# Patient Record
Sex: Male | Born: 1960
Health system: Southern US, Community
[De-identification: ages and names within clinical notes are randomized; demographics above are authoritative.]

## PROBLEM LIST (undated history)

## (undated) DIAGNOSIS — T7840XA Allergy, unspecified, initial encounter: Secondary | ICD-10-CM

## (undated) DIAGNOSIS — I10 Essential (primary) hypertension: Secondary | ICD-10-CM

## (undated) DIAGNOSIS — Z86010 Personal history of colonic polyps: Secondary | ICD-10-CM

## (undated) DIAGNOSIS — M1990 Unspecified osteoarthritis, unspecified site: Secondary | ICD-10-CM

## (undated) DIAGNOSIS — E119 Type 2 diabetes mellitus without complications: Secondary | ICD-10-CM

## (undated) DIAGNOSIS — G4730 Sleep apnea, unspecified: Secondary | ICD-10-CM

## (undated) DIAGNOSIS — E785 Hyperlipidemia, unspecified: Secondary | ICD-10-CM

## (undated) HISTORY — DX: Type 2 diabetes mellitus without complications: E11.9

## (undated) HISTORY — DX: Unspecified osteoarthritis, unspecified site: M19.90

## (undated) HISTORY — PX: UMBILICAL HERNIA REPAIR: SHX196

## (undated) HISTORY — PX: KNEE ARTHROSCOPY: SUR90

## (undated) HISTORY — DX: Sleep apnea, unspecified: G47.30

## (undated) HISTORY — PX: OTHER SURGICAL HISTORY: SHX169

## (undated) HISTORY — DX: Hyperlipidemia, unspecified: E78.5

## (undated) HISTORY — PX: POLYPECTOMY: SHX149

## (undated) HISTORY — DX: Allergy, unspecified, initial encounter: T78.40XA

## (undated) HISTORY — DX: Essential (primary) hypertension: I10

## (undated) HISTORY — DX: Personal history of colonic polyps: Z86.010

## (undated) HISTORY — PX: COLONOSCOPY: SHX174

---
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
## 2001-08-31 ENCOUNTER — Encounter: Admission: RE | Admit: 2001-08-31 | Discharge: 2001-08-31 | Payer: Self-pay | Admitting: General Surgery

## 2001-08-31 ENCOUNTER — Encounter (HOSPITAL_BASED_OUTPATIENT_CLINIC_OR_DEPARTMENT_OTHER): Payer: Self-pay | Admitting: General Surgery

## 2001-08-31 ENCOUNTER — Ambulatory Visit (HOSPITAL_BASED_OUTPATIENT_CLINIC_OR_DEPARTMENT_OTHER): Admission: RE | Admit: 2001-08-31 | Discharge: 2001-08-31 | Payer: Self-pay | Admitting: General Surgery

## 2001-08-31 ENCOUNTER — Encounter (INDEPENDENT_AMBULATORY_CARE_PROVIDER_SITE_OTHER): Payer: Self-pay | Admitting: Specialist

## 2007-10-22 ENCOUNTER — Encounter: Admission: RE | Admit: 2007-10-22 | Discharge: 2007-10-22 | Payer: Self-pay | Admitting: Orthopedic Surgery

## 2007-10-23 ENCOUNTER — Encounter: Admission: RE | Admit: 2007-10-23 | Discharge: 2007-10-23 | Payer: Self-pay | Admitting: Orthopedic Surgery

## 2007-11-13 ENCOUNTER — Ambulatory Visit (HOSPITAL_COMMUNITY): Admission: RE | Admit: 2007-11-13 | Discharge: 2007-11-13 | Payer: Self-pay | Admitting: Orthopedic Surgery

## 2008-07-28 IMAGING — CR DG CHEST 2V
2 series · 2 of 2 positions shown · non-contrast
Comparison: No prior studies are available for comparison.

CLINICAL DATA: Medical meniscal tear. Hypertension. Preadmission workup.
 CHEST - 2 VIEW:

[view not recorded (1 of 2)]
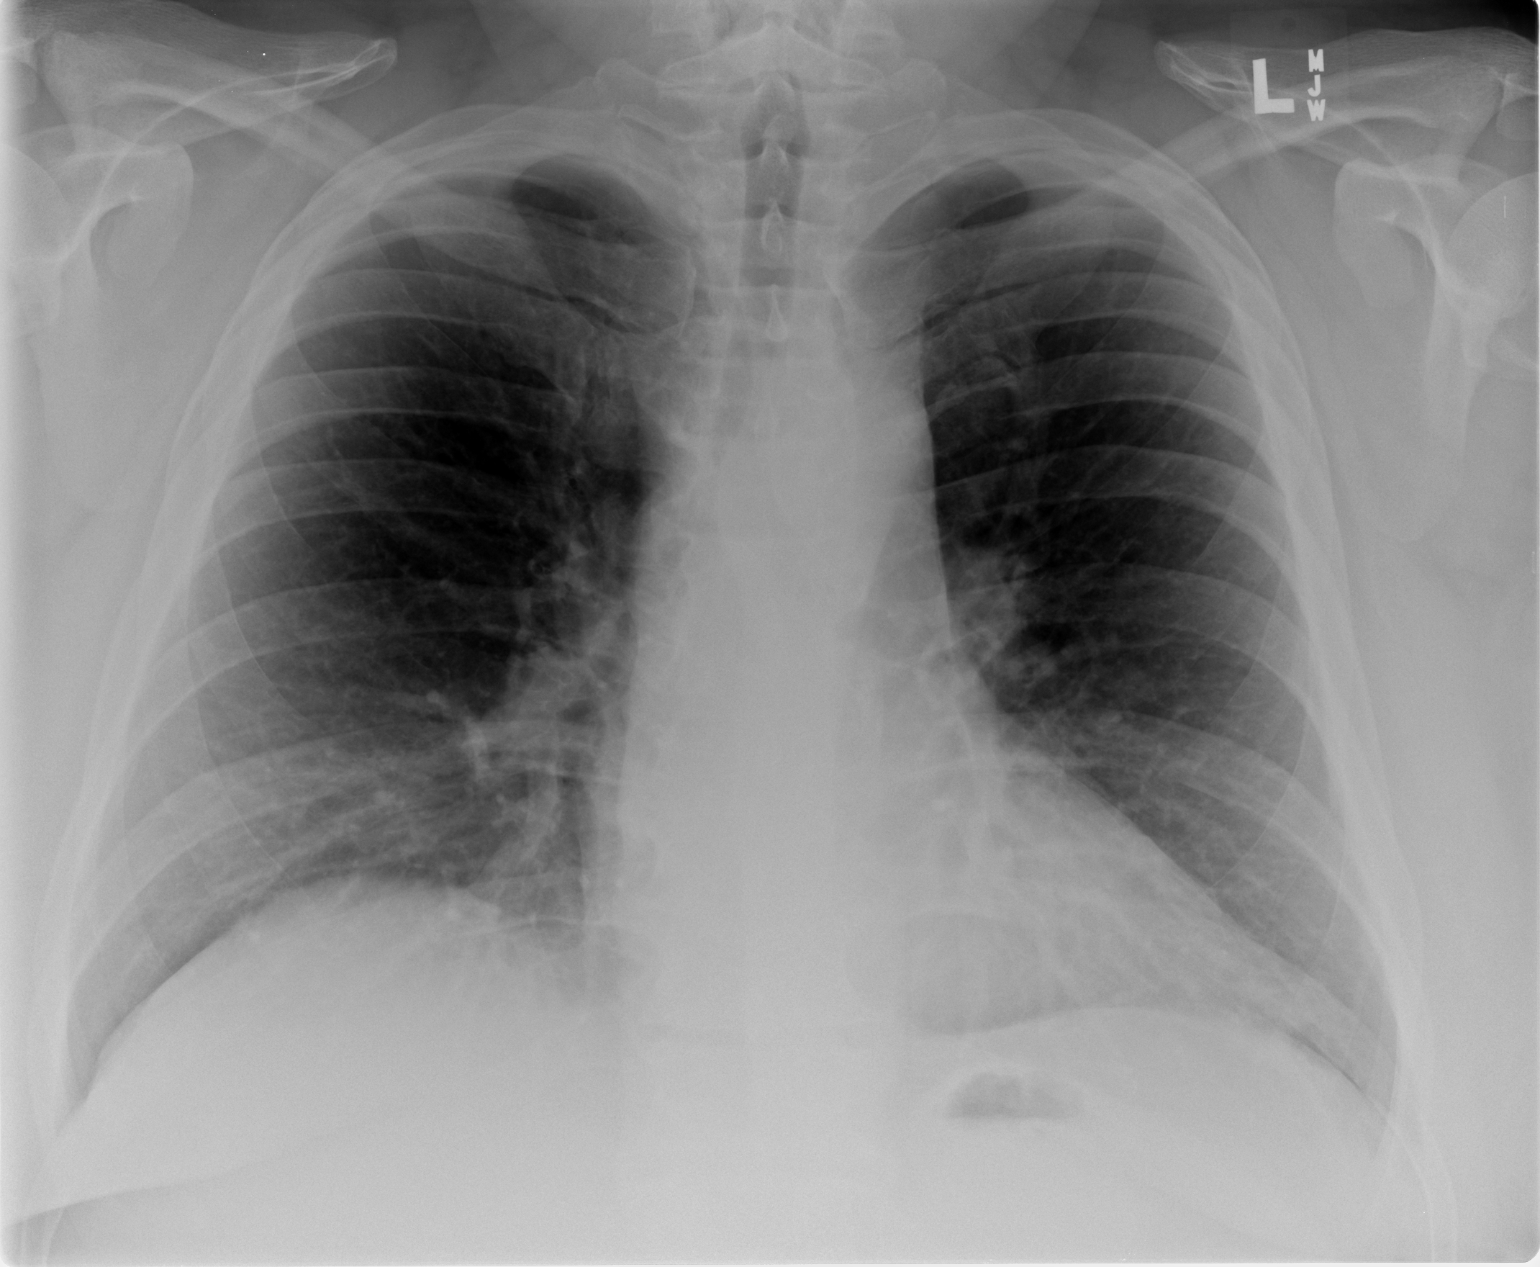

[view not recorded (2 of 2)]
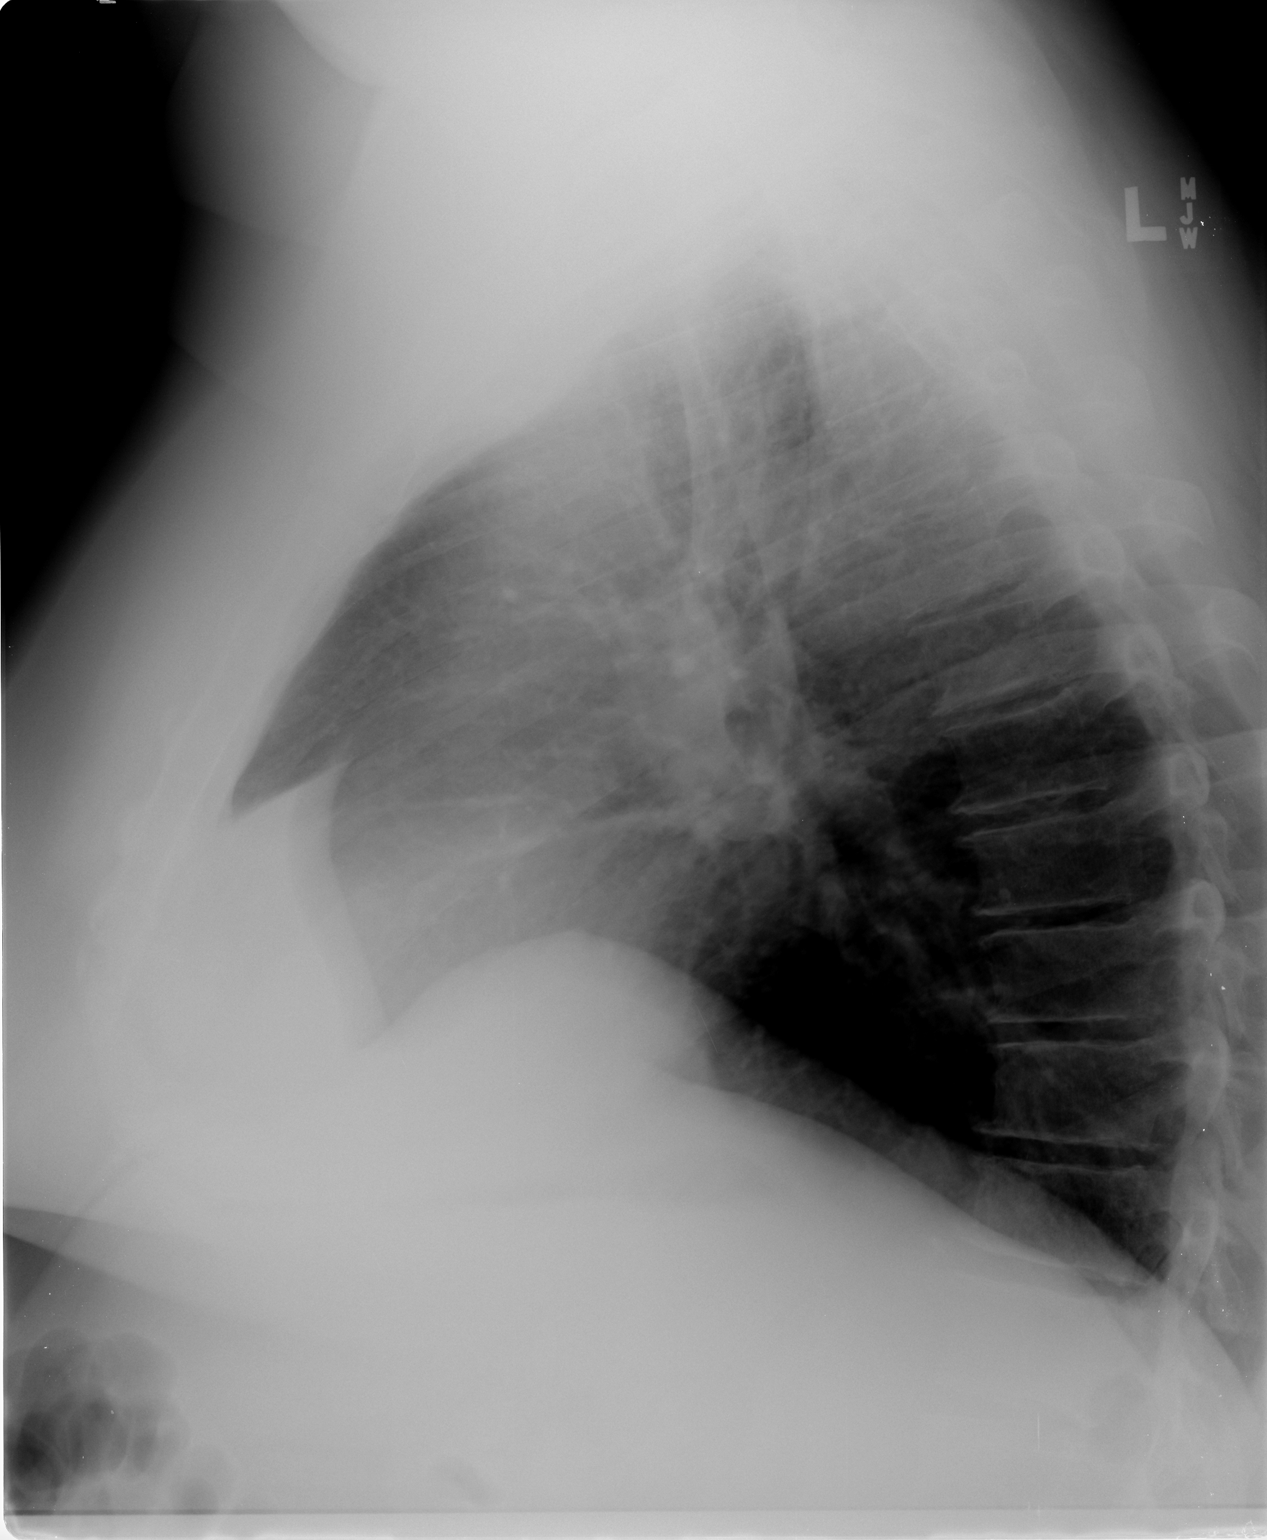

[2 of 2 positions shown; findings below may reference images not displayed]

FINDINGS: Normal cardiac size and shape.  Slight prominence of the caliber of the superior mediastinum likely due to mediastinal lipomatosis. Accentuation of peribronchial markings mainly in the mid to lower lung zones is compatible with chronic bronchitic changes.  No evidence of acute chest disease. No infiltrate. Intact bony thorax.
IMPRESSION: Chronic bronchitic markings. No active chest findings.

## 2009-03-25 ENCOUNTER — Encounter: Admission: RE | Admit: 2009-03-25 | Discharge: 2009-03-25 | Payer: Self-pay | Admitting: Gastroenterology

## 2011-02-08 NOTE — Op Note (Signed)
Christopher Spence, Christopher Spence                 ACCOUNT NO.:  [id number]   MEDICAL RECORD NO.:  [id number]          PATIENT TYPE:  AMB   LOCATION:  SDS                          FACILITY:  [facility]   PHYSICIAN:  Rodney A. Mortenson, M.D.DATE OF BIRTH:  12/31/1960   DATE OF PROCEDURE:  11/13/2007  DATE OF DISCHARGE:                               OPERATIVE REPORT   JUSTIFICATION:  A 46-year male with severe arthritis of his left knee  and very complex torn medial meniscus.  He has significant pain along  the medial joint line.  He is now admitted for debridement of the medial  meniscus in hopes this will diminish his pain to a tolerable level.  Complications discussed preoperatively.  Questions answered and  encouraged.   JUSTIFICATION FOR OUTPATIENT SURGERY:  Minimal morbidity.   PREOPERATIVE DIAGNOSIS:  Osteoarthritis left knee; complex tear of  posterior one half medial meniscus of left knee; early chondromalacia  patellofemoral junction.   POSTOPERATIVE DIAGNOSIS:  Osteoarthritis left knee; complex tear of  posterior one half medial meniscus of left knee; early chondromalacia  patellofemoral junction.   OPERATION:  Arthroscopy; chondroplasty patella and trochlear notch,  femur and medial femoral condyle; subtotal medial meniscectomy, left  knee.   SURGEON:  Lenard Galloway. Chaney Malling, M.D.   ASSISTANT:  Arlys John D. Petrarca, P.A.-C.   ANESTHESIA:  MAC.   PATHOLOGY:  With the arthroscope in the knee, a very careful examination  was undertaken.  Patellofemoral joint showed severe fraying and tearing  of articular cartilage on the posterior aspect of the patella and in the  trochlear notch.  There were areas of full-thickness cartilage loss on  both sides.  The anterior cruciate ligament was normal.  In the lateral  compartments there was fairly normal-looking articular cartilage of the  lateral femoral condyle, lateral tibial plateau and entire lateral  meniscus was essentially normal with some  minor fraying.  The  arthroscope was then placed the medial compartment.  There was an  extensive complex tear of the medial meniscus which included the entire  posterior half of medial meniscus.  This was trapped between the femur  and the tibia.  There was marked fraying and tearing of articular  cartilage of medial femoral condyle and tibial plateau between the  meniscus.   PROCEDURE:  The patient placed on the operating table in supine position  with pneumatic tourniquet about the left upper thigh.  Left leg was  placed in leg holder.  The entire left lower extremity was prepped with  Betadine and draped out in the usual manner.  Infusion cannula was  placed in the superior medial pouch and the knee distended with saline.  Anteromedial, anterolateral portals made.  The arthroscope was  introduced.  The patellofemoral joint was debrided through both medial  and lateral portals.  The frayed and torn cartilage were removed off the  posterior aspect of the patella and in the trochlear notch area of the  femur.  The arthroscope was then placed in the lateral compartment and  this was fairly normal looking lateral compartment as noted above.  In  the medial compartment was extensive tearing of medial meniscus.  Through both medial lateral portals, a series of varus size baskets were  inserted and this was very aggressively debrided.  Once this was  accomplished to my satisfaction, the intra-articular shaver was  introduced and all debris was removed.  The remaining rim was then  smoothed and balanced a nice transition to the anterior one half of the  medial meniscus.  Anterior half was essentially intact.  The femoral  condyle had marked fraying and tearing of articular cartilage and  chondroplasty shaver was used to debride this.  After this was  accomplished, knee was then filled with Marcaine.  A large bulky  pressure dressing applied and the patient returned to recovery room in   excellent condition.  Technically this went extremely well.   DISPOSITION:  1. Discharge.  2. Change dressings on Thursday.  3. Percocet for pain.  4. Usual postop instructions given.  5. To my office on Monday.      Rodney A. Chaney Malling, M.D.  Electronically Signed     RAM/MEDQ  D:  11/13/2007  T:  11/14/2007  Job:  [id number]

## 2011-02-11 NOTE — Op Note (Signed)
[place]  Patient:    Christopher Spence, Christopher Spence Visit Number: [id number] MRN: [id number]          Service Type: DSU Location: [facility] Attending Physician:  Fortino Sic Dictated by:   Marnee Spring. Wiliam Ke, M.D. Proc. Date: 08/31/01 Admit Date:  08/31/2001                             Operative Report  INCOMPLETE  PREOPERATIVE DIAGNOSIS:  Incarcerated recurrent umbilical hernia.  POSTOPERATIVE DIAGNOSIS:  Incarcerated recurrent umbilical hernia.  PROCEDURE: Dictated by:   Marnee Spring. Wiliam Ke, M.D. Attending Physician:  Fortino Sic DD:  08/31/01 TD:  09/01/01 Job: [id number]

## 2011-02-11 NOTE — Op Note (Signed)
[place]  Patient:    FERN, ASMAR Visit Number: [id number] MRN: [id number]          Service Type: DSU Location: [facility] Attending Physician:  Fortino Sic Dictated by:   Marnee Spring. Wiliam Ke, M.D. Proc. Date: 08/31/01 Admit Date:  08/31/2001                             Operative Report  PREOPERATIVE DIAGNOSIS:  Incarcerated recurrent umbilical hernia.  POSTOPERATIVE DIAGNOSIS:  Incarcerated recurrent umbilical hernia.  PROCEDURE:  Repair of incarcerated recurrent umbilical hernia with Marlex patch.  SURGEON:  Marnee Spring. Wiliam Ke, M.D.  ANESTHESIA:  Endotracheal by hospital.  DESCRIPTION OF PROCEDURE:  Under good endotracheal anesthesia, the skin of the abdomen was prepped and draped in the usual manner.  Previously-used subumbilical incision was opened and elongated.  There was a huge hernia sac, which was dissected free from all surrounding soft tissue, including the skin of the umbilicus.  It was opened and was found to contain incarcerated omentum.  The hernia sac was trimmed.  The omentum could not be replaced.  It was serially clamped, cut, and tied off with 0 silk ties.  Finally the omentum could be replaced in the abdominal cavity.  Several small hernias around the circumference of the large hernia were felt with a finger inside the abdominal cavity.  The scarred tissue bands were divided to make one large hernia.  This hernia was then repaired with a Marlex patch, which was sewn in place with horizontal mattress sutures of musculofascial #1 Novofil.  Soft tissue of the parietes was then closed over the patch with 0 Novofil sutures.  Several 3-0 Vicryls were placed in the subcutaneous tissue.  The skin was closed with subcuticular 4-0 Vicryl and Steri-Strips were applied.  A dressing was applied.  Estimated blood loss was minimal.  The patient received no blood and left the operating room in satisfactory condition after sponge and  needle counts were verified. Dictated by:   Marnee Spring. Wiliam Ke, M.D. Attending Physician:  Fortino Sic DD:  08/31/01 TD:  09/01/01 Job: [id number]

## 2011-06-17 LAB — COMPREHENSIVE METABOLIC PANEL
AST: 36
Albumin: 3.9
BUN: 10
Calcium: 9.8
Creatinine, Ser: 0.78
GFR calc Af Amer: >60
Total Bilirubin: 0.8
Total Protein: 6.3

## 2011-06-17 LAB — CBC
HCT: 43.8
MCHC: 34.1
MCV: 86.5
Platelets: 208
RDW: 12.7
WBC: 6.9

## 2011-06-17 LAB — DIFFERENTIAL
Basophils Absolute: 0
Eosinophils Relative: 1
Lymphocytes Relative: 32
Lymphs Abs: 2.2
Monocytes Absolute: 1
Neutro Abs: 3.7

## 2011-06-17 LAB — URINALYSIS, ROUTINE W REFLEX MICROSCOPIC
Glucose, UA: NEGATIVE
Ketones, ur: NEGATIVE
Nitrite: NEGATIVE
Protein, ur: NEGATIVE
pH: 6

## 2011-06-17 LAB — APTT: aPTT: 26

## 2012-07-06 ENCOUNTER — Encounter: Payer: Self-pay | Admitting: Internal Medicine

## 2012-08-08 ENCOUNTER — Ambulatory Visit (AMBULATORY_SURGERY_CENTER): Payer: 59 | Admitting: *Deleted

## 2012-08-08 ENCOUNTER — Telehealth: Payer: Self-pay | Admitting: *Deleted

## 2012-08-08 ENCOUNTER — Encounter: Payer: Self-pay | Admitting: Internal Medicine

## 2012-08-08 VITALS — Ht 73.0 in | Wt 365.8 lb

## 2012-08-08 DIAGNOSIS — Z1211 Encounter for screening for malignant neoplasm of colon: Secondary | ICD-10-CM

## 2012-08-08 NOTE — Telephone Encounter (Signed)
Sheri: Pt here for PV for screening colonoscopy with Dr. Leone Payor. pt's weight is 365.8 lb; BMI 48.26.  Pt informed that procedure will need to be done at [facility].  He prefers a Monday.  He is agreeable to wait until Jan to have procedure.  I told him that you would call mid December to schedule.  He also knows to call you if he has not heard from you.  I gave him a copy of Suprep instructions and he knows you will review when procedure is scheduled; he is diabetic.  He did not want to get rx for prep until procedure was scheduled so it has not been sent to his pharmacy.  Best phone number to reach pt: [phone]. Thanks, Olegario Messier

## 2012-08-08 NOTE — Telephone Encounter (Signed)
I have sent myself a reminder flag to contact the patient mid december

## 2012-08-08 NOTE — Progress Notes (Signed)
Pt here for PV for screening colonoscopy with Dr. Leone Payor..  Pt's weight is 365.8 lb.  I talked with Darcey Nora to scheduled pt at hospital due to weight.  Pt prefers to have procedure on a Monday.  Lavonna Rua will call pt to schedule procedure in mid December for a January procedure.

## 2012-08-22 ENCOUNTER — Encounter: Payer: Self-pay | Admitting: Internal Medicine

## 2012-09-11 ENCOUNTER — Telehealth: Payer: Self-pay

## 2012-09-11 DIAGNOSIS — Z1211 Encounter for screening for malignant neoplasm of colon: Secondary | ICD-10-CM

## 2012-09-11 NOTE — Telephone Encounter (Signed)
Hospital week for Dr. Leone Payor is 10/15/12 -10/19/12

## 2012-09-11 NOTE — Telephone Encounter (Signed)
Message copied by Annett Fabian on Tue Sep 11, 2012  4:26 PM ------      Message from: Annett Fabian      Created: Wed Aug 08, 2012 10:25 AM       Needs colon at hospital January hospital week for gessner.  Can only come on a Monday

## 2012-09-11 NOTE — Telephone Encounter (Signed)
Patient notified about scheduling colonoscopy.  Patient states he needs to call me back tomorrow .

## 2012-09-20 ENCOUNTER — Other Ambulatory Visit: Payer: Self-pay

## 2012-09-20 ENCOUNTER — Encounter: Payer: Self-pay | Admitting: Internal Medicine

## 2012-09-20 DIAGNOSIS — Z1211 Encounter for screening for malignant neoplasm of colon: Secondary | ICD-10-CM

## 2012-09-20 MED ORDER — PEG-KCL-NACL-NASULF-NA ASC-C 100 G PO SOLR: 1.0000 | Freq: Once | ORAL | Status: DC

## 2012-09-20 NOTE — Telephone Encounter (Signed)
Patient is scheduled for 10/16/11 @ 0830 at [facility].Marland Kitchen  He already has had a previsit and I have reviewed the new instruction dates and times with him and mailed a new copy.

## 2012-10-15 ENCOUNTER — Encounter (HOSPITAL_COMMUNITY)
Admission: RE | Disposition: A | Discharge status: Home / Self Care | Payer: Self-pay | Source: Ambulatory Visit | Attending: Internal Medicine

## 2012-10-15 ENCOUNTER — Encounter (HOSPITAL_COMMUNITY): Payer: Self-pay | Admitting: *Deleted

## 2012-10-15 ENCOUNTER — Ambulatory Visit (HOSPITAL_COMMUNITY)
Admission: RE | Admit: 2012-10-15 | Discharge: 2012-10-15 | Disposition: A | Discharge status: Home / Self Care | Payer: 59 | Source: Ambulatory Visit | Attending: Internal Medicine | Admitting: Internal Medicine

## 2012-10-15 DIAGNOSIS — E119 Type 2 diabetes mellitus without complications: Secondary | ICD-10-CM | POA: Insufficient documentation

## 2012-10-15 DIAGNOSIS — Z860100 Personal history of colon polyps, unspecified: Secondary | ICD-10-CM

## 2012-10-15 DIAGNOSIS — Z1211 Encounter for screening for malignant neoplasm of colon: Secondary | ICD-10-CM

## 2012-10-15 DIAGNOSIS — D126 Benign neoplasm of colon, unspecified: Secondary | ICD-10-CM | POA: Insufficient documentation

## 2012-10-15 DIAGNOSIS — E785 Hyperlipidemia, unspecified: Secondary | ICD-10-CM | POA: Insufficient documentation

## 2012-10-15 DIAGNOSIS — I10 Essential (primary) hypertension: Secondary | ICD-10-CM | POA: Insufficient documentation

## 2012-10-15 DIAGNOSIS — D129 Benign neoplasm of anus and anal canal: Secondary | ICD-10-CM

## 2012-10-15 DIAGNOSIS — D128 Benign neoplasm of rectum: Secondary | ICD-10-CM

## 2012-10-15 HISTORY — DX: Personal history of colonic polyps: Z86.010

## 2012-10-15 HISTORY — PX: COLONOSCOPY: SHX5424

## 2012-10-15 HISTORY — DX: Personal history of colon polyps, unspecified: Z86.0100

## 2012-10-15 LAB — GLUCOSE, CAPILLARY: Glucose-Capillary: 148 mg/dL — ABNORMAL HIGH (ref 70–99)

## 2012-10-15 SURGERY — COLONOSCOPY
Anesthesia: Moderate Sedation

## 2012-10-15 MED ORDER — SODIUM CHLORIDE 0.9 % IV SOLN: INTRAVENOUS | Status: DC

## 2012-10-15 MED ORDER — FENTANYL CITRATE 0.05 MG/ML IJ SOLN
INTRAMUSCULAR | Status: AC
  Filled 2012-10-15: qty 2

## 2012-10-15 MED ORDER — FENTANYL CITRATE 0.05 MG/ML IJ SOLN
INTRAMUSCULAR | Status: DC | PRN
  Administered 2012-10-15 (×4): 25 ug via INTRAVENOUS

## 2012-10-15 MED ORDER — MIDAZOLAM HCL 5 MG/5ML IJ SOLN
INTRAMUSCULAR | Status: DC | PRN
  Administered 2012-10-15 (×4): 2 mg via INTRAVENOUS

## 2012-10-15 MED ORDER — MIDAZOLAM HCL 10 MG/2ML IJ SOLN
INTRAMUSCULAR | Status: AC
  Filled 2012-10-15: qty 2

## 2012-10-15 NOTE — H&P (Signed)
  HPI  52 yo man here for routine colon cancer screening with colonoscopy. He has no active GI c/o.    No Known Allergies Outpatient meds reviewed Past Medical History  Diagnosis Date  . Diabetes   . Hypertension   . Arthritis     knees  . Hyperlipidemia    Past Surgical History  Procedure Date  . Umbilical hernia repair     x 2  . Knee arthrotomy 1976    right  . Knee arthroscopy     bilateral; twice on each knee   History   Social History  . Marital Status: Married    Spouse Name: N/A    Number of Children: N/A  . Years of Education: N/A   Social History Main Topics  . Smoking status: Never Smoker   . Smokeless tobacco: Never Used  . Alcohol Use: 21.0 oz/week    35 Cans of beer per week     Comment: 4-5 times weekly,4 to 5 beers  . Drug Use: No  . Sexually Active: None   Other Topics Concern  . None   Social History Narrative  . None   Family History  Problem Relation Age of Onset  . Colon cancer Neg Hx   . Stomach cancer Neg Hx     PE See pre-sedation evaluation   Ass/Plan  52 yo routine risk patient for colorectal cancer screening.  Colonoscopy to be performed with moderate sedation.  The risks and benefits as well as alternatives of endoscopic procedure(s) have been discussed and reviewed. All questions answered. The patient agrees to proceed.

## 2012-10-15 NOTE — Op Note (Signed)
[facility] [address] [place], [address]   COLONOSCOPY PROCEDURE REPORT  PATIENT: Christopher Spence, Christopher Spence  MR#: [id number] BIRTHDATE: 1961-07-28 , 51  yrs. old GENDER: Male ENDOSCOPIST: Iva Boop, MD, [facility] REFERRED VH:QIONGE Tanya Nones, M.D. PROCEDURE DATE:  10/15/2012 PROCEDURE:   Colonoscopy with snare polypectomy ASA CLASS:   Class II INDICATIONS:Average risk patient for colorectal cancer. MEDICATIONS: Propofol (Diprivan), Fentanyl 100 mcg IV, and Versed 8 mg IV  DESCRIPTION OF PROCEDURE:   After the risks benefits and alternatives of the procedure were thoroughly explained, informed consent was obtained.  A digital rectal exam revealed no abnormalities of the rectum and A digital rectal exam revealed the prostate was not enlarged.   The Pentax Colonoscope [id number] endoscope was introduced through the anus and advanced to the cecum, which was identified by both the appendix and ileocecal valve. No adverse events experienced.   The quality of the prep was good, using MoviPrep  The instrument was then slowly withdrawn as the colon was fully examined.      COLON FINDINGS: A polypoid shaped semi-pedunculated polyp measuring 12 mm in size was found in the sigmoid colon.  A polypectomy was performed using snare cautery.  The resection was complete and the polyp tissue was completely retrieved.   Two polypoid shaped sessile polyps measuring 4-7 mm in size were found in the sigmoid colon and rectum.  A polypectomy was performed with a cold snare. The resection was complete and the polyp tissue was completely retrieved.   The colon mucosa was otherwise normal.   A right colon retroflexion was performed.  Retroflexed views in rectum and right colon revealed no abnormalities except rectal polyp. The time to cecum=2 minutes 0 seconds.  Withdrawal time=15 minutes 0 seconds. The scope was withdrawn and the procedure completed. COMPLICATIONS: There were no  complications.  ENDOSCOPIC IMPRESSION: 1.   Semi-pedunculated polyp measuring 12 mm in size was found in the sigmoid colon; polypectomy was performed using snare cautery 2.   Two sessile polyps measuring 4-7 mm in size were found in the sigmoid colon and rectum; polypectomy was performed with a cold snare 3.   The colon mucosa was otherwise normal - excellent prep - index screening colonoscopy  RECOMMENDATIONS: 1.  Hold aspirin, aspirin products, and anti-inflammatory medication for 1 week. 2.  Timing of repeat colonoscopy will be determined by pathology findings. eSigned:  Iva Boop, MD, [facility] 10/15/2012 9:33 AM   cc: Lynnea Ferrier, MD and The Patient

## 2012-10-16 ENCOUNTER — Encounter (HOSPITAL_COMMUNITY): Payer: Self-pay | Admitting: Internal Medicine

## 2012-10-17 ENCOUNTER — Encounter: Payer: Self-pay | Admitting: Internal Medicine

## 2012-10-17 NOTE — Progress Notes (Signed)
Quick Note:  3 adenomas max 12 mm Repeat colonoscopy about 09/2015 ______

## 2013-02-06 ENCOUNTER — Other Ambulatory Visit: Payer: Self-pay | Admitting: Family Medicine

## 2013-02-06 NOTE — Telephone Encounter (Addendum)
Medication refilled per protocol.Patient needs to be seen before any further refills.  Called pt and left mess to call and make appt.

## 2013-02-07 ENCOUNTER — Other Ambulatory Visit (INDEPENDENT_AMBULATORY_CARE_PROVIDER_SITE_OTHER): Payer: 59

## 2013-02-07 DIAGNOSIS — Z0000 Encounter for general adult medical examination without abnormal findings: Secondary | ICD-10-CM

## 2013-02-07 DIAGNOSIS — E119 Type 2 diabetes mellitus without complications: Secondary | ICD-10-CM

## 2013-02-07 DIAGNOSIS — Z79899 Other long term (current) drug therapy: Secondary | ICD-10-CM

## 2013-02-07 DIAGNOSIS — I10 Essential (primary) hypertension: Secondary | ICD-10-CM

## 2013-02-07 DIAGNOSIS — K7581 Nonalcoholic steatohepatitis (NASH): Secondary | ICD-10-CM

## 2013-02-07 DIAGNOSIS — E785 Hyperlipidemia, unspecified: Secondary | ICD-10-CM

## 2013-02-07 LAB — LIPID PANEL
Cholesterol: 174 mg/dL (ref 0–200)
VLDL: 28 mg/dL (ref 0–40)

## 2013-02-07 LAB — CBC WITH DIFFERENTIAL/PLATELET
Basophils Absolute: 0 10*3/uL (ref 0.0–0.1)
HCT: 46.8 % (ref 39.0–52.0)
Hemoglobin: 16 g/dL (ref 13.0–17.0)
Lymphocytes Relative: 26 % (ref 12–46)
Monocytes Absolute: 0.6 10*3/uL (ref 0.1–1.0)
Neutro Abs: 4.4 10*3/uL (ref 1.7–7.7)
Neutrophils Relative %: 64 % (ref 43–77)
RDW: 13.5 % (ref 11.5–15.5)
WBC: 6.9 10*3/uL (ref 4.0–10.5)

## 2013-02-07 LAB — TSH: TSH: 1.475 u[IU]/mL (ref 0.350–4.500)

## 2013-02-07 LAB — HEMOGLOBIN A1C
Hgb A1c MFr Bld: 6.8 % — ABNORMAL HIGH (ref <5.7)
Mean Plasma Glucose: 148 mg/dL — ABNORMAL HIGH (ref <117)

## 2013-02-07 LAB — PSA: PSA: 1.09 ng/mL (ref <=4.00)

## 2013-02-08 LAB — VITAMIN D 25 HYDROXY (VIT D DEFICIENCY, FRACTURES): Vit D, 25-Hydroxy: 42 ng/mL (ref 30–89)

## 2013-02-21 ENCOUNTER — Encounter: Payer: Self-pay | Admitting: Family Medicine

## 2013-02-21 ENCOUNTER — Ambulatory Visit (INDEPENDENT_AMBULATORY_CARE_PROVIDER_SITE_OTHER): Payer: 59 | Admitting: Family Medicine

## 2013-02-21 VITALS — BP 150/90 | HR 78 | Temp 98.4°F | Resp 20 | Ht 73.0 in | Wt 365.0 lb

## 2013-02-21 DIAGNOSIS — E119 Type 2 diabetes mellitus without complications: Secondary | ICD-10-CM

## 2013-02-21 DIAGNOSIS — Z0000 Encounter for general adult medical examination without abnormal findings: Secondary | ICD-10-CM

## 2013-02-21 DIAGNOSIS — E785 Hyperlipidemia, unspecified: Secondary | ICD-10-CM | POA: Insufficient documentation

## 2013-02-21 DIAGNOSIS — I10 Essential (primary) hypertension: Secondary | ICD-10-CM

## 2013-02-21 LAB — COMPLETE METABOLIC PANEL WITHOUT GFR
ALT: 84 U/L — ABNORMAL HIGH (ref 0–53)
AST: 38 U/L — ABNORMAL HIGH (ref 0–37)
Albumin: 4.4 g/dL (ref 3.5–5.2)
Alkaline Phosphatase: 85 U/L (ref 39–117)
BUN: 14 mg/dL (ref 6–23)
CO2: 28 meq/L (ref 19–32)
Calcium: 9.7 mg/dL (ref 8.4–10.5)
Chloride: 102 meq/L (ref 96–112)
Creat: 0.78 mg/dL (ref 0.50–1.35)
GFR, Est African American: >89 mL/min
GFR, Est Non African American: >89 mL/min
Glucose, Bld: 204 mg/dL — ABNORMAL HIGH (ref 70–99)
Potassium: 5.3 meq/L (ref 3.5–5.3)
Sodium: 140 meq/L (ref 135–145)
Total Bilirubin: 0.5 mg/dL (ref 0.3–1.2)
Total Protein: 6.8 g/dL (ref 6.0–8.3)

## 2013-02-21 MED ORDER — SILDENAFIL CITRATE 100 MG PO TABS: 50.0000 mg | ORAL_TABLET | Freq: Every day | ORAL | Status: DC | PRN

## 2013-02-21 NOTE — Progress Notes (Signed)
Subjective:    Patient ID: Christopher Spence, male    DOB: 10/24/1960, 52 y.o.   MRN: [id number]  HPI Patient is here today for a complete physical exam. His past medical history is significant for hypertension, hyperlipidemia, and diabetes. He recently had lab work that is as follows: Lab on 02/07/2013  Component Date Value Range Status  . Cholesterol 02/07/2013 174  0 - 200 mg/dL Final   Comment: ATP III Classification:                                < 200        mg/dL        Desirable                               200 - 239     mg/dL        Borderline High                               >= 240        mg/dL        High                             . Triglycerides 02/07/2013 142  <150 mg/dL Final  . HDL 40/98/1191 48  >39 mg/dL Final  . Total CHOL/HDL Ratio 02/07/2013 3.6   Final  . VLDL 02/07/2013 28  0 - 40 mg/dL Final  . LDL Cholesterol 02/07/2013 98  0 - 99 mg/dL Final   Comment:                            Total Cholesterol/HDL Ratio:CHD Risk                                                 Coronary Heart Disease Risk Table                                                                 Men       Women                                   1/2 Average Risk              3.4        3.3                                       Average Risk              5.0        4.4  2X Average Risk              9.6        7.1                                    3X Average Risk             23.4       11.0                          Use the calculated Patient Ratio above and the CHD Risk table                           to determine the patient's CHD Risk.                          ATP III Classification (LDL):                                < 100        mg/dL         Optimal                               100 - 129     mg/dL         Near or Above Optimal                               130 - 159     mg/dL         Borderline High                               160 - 189     mg/dL          High                                > 190        mg/dL         Very High                             . Hemoglobin A1C 02/07/2013 6.8* <5.7 % Final   Comment:                                                                                                 According to the ADA Clinical Practice Recommendations for 2011, when                          HbA1c is used  as a screening test:                                                       >=6.5%   Diagnostic of Diabetes Mellitus                                     (if abnormal result is confirmed)                                                     5.7-6.4%   Increased risk of developing Diabetes Mellitus                                                     References:Diagnosis and Classification of Diabetes Mellitus,Diabetes                          Care,2011,34(Suppl 1):S62-S69 and Standards of Medical Care in                                  Diabetes - 2011,Diabetes Care,2011,34 (Suppl 1):S11-S61.                             . Mean Plasma Glucose 02/07/2013 148* <117 mg/dL Final  . WBC 16/06/9603 6.9  4.0 - 10.5 K/uL Final  . RBC 02/07/2013 5.37  4.22 - 5.81 MIL/uL Final  . Hemoglobin 02/07/2013 16.0  13.0 - 17.0 g/dL Final  . HCT 54/05/8118 46.8  39.0 - 52.0 % Final  . MCV 02/07/2013 87.2  78.0 - 100.0 fL Final  . MCH 02/07/2013 29.8  26.0 - 34.0 pg Final  . MCHC 02/07/2013 34.2  30.0 - 36.0 g/dL Final  . RDW 14/78/2956 13.5  11.5 - 15.5 % Final  . Platelets 02/07/2013 209  150 - 400 K/uL Final  . Neutrophils Relative % 02/07/2013 64  43 - 77 % Final  . Neutro Abs 02/07/2013 4.4  1.7 - 7.7 K/uL Final  . Lymphocytes Relative 02/07/2013 26  12 - 46 % Final  . Lymphs Abs 02/07/2013 1.8  0.7 - 4.0 K/uL Final  . Monocytes Relative 02/07/2013 8  3 - 12 % Final  . Monocytes Absolute 02/07/2013 0.6  0.1 - 1.0 K/uL Final  . Eosinophils Relative 02/07/2013 2  0 - 5 % Final  . Eosinophils Absolute 02/07/2013 0.1  0.0 - 0.7 K/uL Final  .  Basophils Relative 02/07/2013 0  0 - 1 % Final  . Basophils Absolute 02/07/2013 0.0  0.0 - 0.1 K/uL Final  . Smear Review 02/07/2013 Criteria for review not met   Final  . PSA 02/07/2013 1.09  <=4.00 ng/mL Final   Comment: Test Methodology: ECLIA PSA (Electrochemiluminescence Immunoassay)  For PSA values from 2.5-4.0, particularly in younger men <60 years                          old, the AUA and NCCN suggest testing for % Free PSA (3515) and                          evaluation of the rate of increase in PSA (PSA velocity).  . TSH 02/07/2013 1.475  0.350 - 4.500 uIU/mL Final  . Vit D, 25-Hydroxy 02/07/2013 42  30 - 89 ng/mL Final   Comment: This assay accurately quantifies Vitamin D, which is the sum of the                          25-Hydroxy forms of Vitamin D2 and D3.  Studies have shown that the                          optimum concentration of 25-Hydroxy Vitamin D is 30 ng/mL or higher.                           Concentrations of Vitamin D between 20 and 29 ng/mL are considered to                          be insufficient and concentrations less than 20 ng/mL are considered                          to be deficient for Vitamin D.  We discussed this today in clinic. His HgA1c is 6.8 and greater than his goal. His LDL and triglycerides are at goal. His HDL is slightly low. He is resistant to any medications.  He has not seen an eye doctor. He is not taking an aspirin. He states his blood pressure is usually well controlled at home. He gets nervous in the doctor's office. However he has no home patient me to review. Past Medical History  Diagnosis Date  . Arthritis     knees  . Personal history of colonic polyps-adenomas 10/15/2012  . Hypertension   . Hyperlipidemia   . Diabetes    Current Outpatient Prescriptions on File Prior to Visit  Medication Sig Dispense Refill  . amLODipine (NORVASC) 10 MG tablet TAKE 1 TABLET BY MOUTH EVERY DAY   30 tablet  0  . fish oil-omega-3 fatty acids 1000 MG capsule Take by mouth daily. Takes 2 capsules daily      . glipiZIDE (GLUCOTROL) 5 MG tablet TAKE 1 TABLET BY MOUTH EVERY DAY  30 tablet  0  . Green Tea, Camillia sinensis, (GREEN TEA PO) Take by mouth. Takes 2 green tea capsules daily      . losartan-hydrochlorothiazide (HYZAAR) 100-12.5 MG per tablet TAKE 1 TABLET EVERY DAY  30 tablet  0  . metFORMIN (GLUCOPHAGE) 1000 MG tablet TAKE 1 TABLET BY MOUTH TWICE A DAY  60 tablet  0   No current facility-administered medications on file prior to visit.   No Known Allergies History   Social History  . Marital Status: Married    Spouse Name: N/A    Number of Children: N/A  . Years of Education: N/A   Occupational History  . Not on file.  Social History Main Topics  . Smoking status: Never Smoker   . Smokeless tobacco: Never Used  . Alcohol Use: 21.0 oz/week    35 Cans of beer per week     Comment: 4-5 times weekly,4 to 5 beers  . Drug Use: No  . Sexually Active: Yes     Comment: married   Other Topics Concern  . Not on file   Social History Narrative  . No narrative on file   Family History  Problem Relation Age of Onset  . Colon cancer Neg Hx   . Stomach cancer Neg Hx   . Cancer Mother     breast  . Cancer Father        Review of Systems  All other systems reviewed and are negative.       Objective:   Physical Exam  Vitals reviewed. Constitutional: He is oriented to person, place, and time. He appears well-developed and well-nourished.  HENT:  Head: Normocephalic and atraumatic.  Right Ear: External ear normal.  Left Ear: External ear normal.  Nose: Nose normal.  Mouth/Throat: Oropharynx is clear and moist. No oropharyngeal exudate.  Eyes: Conjunctivae and EOM are normal. Pupils are equal, round, and reactive to light. Right eye exhibits no discharge. Left eye exhibits no discharge. No scleral icterus.  Neck: Normal range of motion. Neck supple. No JVD  present. No tracheal deviation present. No thyromegaly present.  Cardiovascular: Normal rate, regular rhythm and normal heart sounds.  Exam reveals no gallop and no friction rub.   No murmur heard. Pulmonary/Chest: Effort normal and breath sounds normal. No respiratory distress. He has no wheezes. He has no rales. He exhibits no tenderness.  Abdominal: Soft. Bowel sounds are normal. He exhibits no distension and no mass. There is no tenderness. There is no rebound and no guarding.  Genitourinary: Rectum normal, prostate normal and penis normal.  Musculoskeletal: Normal range of motion. He exhibits no edema and no tenderness.  Lymphadenopathy:    He has no cervical adenopathy.  Neurological: He is alert and oriented to person, place, and time. He has normal reflexes. He displays normal reflexes. No cranial nerve deficit. He exhibits normal muscle tone. Coordination normal.  Skin: Skin is warm and dry. No rash noted. No erythema. No pallor.  Psychiatric: He has a normal mood and affect. His behavior is normal. Judgment and thought content normal.          Assessment & Plan:  1. HTN (hypertension) Patient is to check blood pressure every day for 2 weeks and bring values in for me to review. If his blood pressures greater than 140/90 I would add medication - COMPLETE METABOLIC PANEL WITH GFR  2. Type II or unspecified type diabetes mellitus without mention of complication, not stated as uncontrolled Refuses medication. Like to try therapeutic lifestyle changes including 10-15 pounds weight loss, increasing daily aerobic exercise, and lowering his carb intake. I recommended cessation of alcohol. I also consult ophthalmology for a regular eye exam.  Start aspirin 81 mg by mouth daily - Ambulatory referral to Ophthalmology  3. Routine general medical examination at a health care facility Colonoscopy is up-to-date, tetanus shot is up-to-date. Next colonoscopy is due in 2017. Next tetanus in  2016. Other preventative health maintenance is up to date. We reviewed his labs in full with the patient in the office. I recommended diet exercise and weight loss

## 2013-02-22 ENCOUNTER — Ambulatory Visit (INDEPENDENT_AMBULATORY_CARE_PROVIDER_SITE_OTHER): Payer: 59 | Admitting: *Deleted

## 2013-02-22 VITALS — BP 160/94

## 2013-02-22 DIAGNOSIS — I10 Essential (primary) hypertension: Secondary | ICD-10-CM

## 2013-03-19 ENCOUNTER — Other Ambulatory Visit: Payer: Self-pay | Admitting: Family Medicine

## 2013-04-22 ENCOUNTER — Encounter: Payer: Self-pay | Admitting: Family Medicine

## 2013-04-22 ENCOUNTER — Ambulatory Visit (INDEPENDENT_AMBULATORY_CARE_PROVIDER_SITE_OTHER): Payer: 59 | Admitting: Family Medicine

## 2013-04-22 VITALS — BP 140/90 | HR 88 | Temp 99.3°F | Resp 18 | Wt 366.0 lb

## 2013-04-22 DIAGNOSIS — H60399 Other infective otitis externa, unspecified ear: Secondary | ICD-10-CM

## 2013-04-22 DIAGNOSIS — H60339 Swimmer's ear, unspecified ear: Secondary | ICD-10-CM | POA: Insufficient documentation

## 2013-04-22 DIAGNOSIS — H60332 Swimmer's ear, left ear: Secondary | ICD-10-CM

## 2013-04-22 MED ORDER — CIPROFLOXACIN-DEXAMETHASONE 0.3-0.1 % OT SUSP: 4.0000 [drp] | Freq: Two times a day (BID) | OTIC | Status: DC

## 2013-04-22 MED ORDER — ANTIPYRINE-BENZOCAINE 5.4-1.4 % OT SOLN: 3.0000 [drp] | Freq: Four times a day (QID) | OTIC | Status: DC | PRN

## 2013-04-22 NOTE — Progress Notes (Signed)
  Subjective:    Patient ID: Christopher Spence, male    DOB: 1961-04-02, 52 y.o.   MRN: [id number]  HPI  Pt here with left ear pain past 48 hours, feels like previous swimmer's ear. [place], over the weekend symptoms started. +drainage from left ear, feels swollen inside,pain when he pulls on ear.   Review of Systems - per above  GEN- denies fatigue, fever, weight loss,weakness, recent illness HEENT- denies eye drainage, change in vision, nasal discharge,+ear pain CVS- denies chest pain, palpitations RESP- denies SOB, cough, wheeze Neuro- denies headache, dizziness, syncope, seizure activity        Objective:   Physical Exam GEN- NAD, alert and oriented x3 HEENT- PERRL, EOMI, non injected sclera, pink conjunctiva, MMM, oropharynx clear, Right TM / canal clear , left canal inflamed with edema, unable to visualize TM, +clear drainage from left ear, pain with manipulation of pinna, nares clear Neck- Supple, no LAD          Assessment & Plan:

## 2013-04-22 NOTE — Assessment & Plan Note (Signed)
ciprodex given, AB otic If no improvement in 48 hours, would send to ENT to have wick placed to get drops in

## 2013-04-22 NOTE — Patient Instructions (Signed)
Antibiotic drop for ear twice a day AB otic more for pain Call if no improvement

## 2013-05-22 ENCOUNTER — Telehealth: Payer: Self-pay | Admitting: Family Medicine

## 2013-05-22 MED ORDER — CIPROFLOXACIN-DEXAMETHASONE 0.3-0.1 % OT SUSP: 4.0000 [drp] | Freq: Two times a day (BID) | OTIC | Status: DC

## 2013-05-22 MED ORDER — ANTIPYRINE-BENZOCAINE 5.4-1.4 % OT SOLN: 3.0000 [drp] | Freq: Four times a day (QID) | OTIC | Status: DC | PRN

## 2013-05-22 NOTE — Telephone Encounter (Signed)
Advise him only to start if symptoms return,

## 2013-05-23 NOTE — Telephone Encounter (Signed)
Pt aware.

## 2013-08-20 ENCOUNTER — Telehealth: Payer: Self-pay | Admitting: Family Medicine

## 2013-08-20 NOTE — Telephone Encounter (Signed)
Insurance company had faxed over a form that was suppose to be filled out showing 2 days of service for A1C and Certianine however when it was sent it only had one day of service for each of those tests. Peggy, Herberts wife is calling to see if he needs to come to get the other two test done. Call back number is [phone]

## 2013-08-21 NOTE — Telephone Encounter (Signed)
Pt's wife aware [facility] and appt made for DM ck

## 2013-08-29 ENCOUNTER — Encounter: Payer: Self-pay | Admitting: Family Medicine

## 2013-08-29 ENCOUNTER — Other Ambulatory Visit: Payer: Self-pay | Admitting: Family Medicine

## 2013-08-29 ENCOUNTER — Ambulatory Visit (INDEPENDENT_AMBULATORY_CARE_PROVIDER_SITE_OTHER): Payer: 59 | Admitting: Family Medicine

## 2013-08-29 VITALS — BP 140/86 | HR 82 | Temp 97.3°F | Resp 22 | Ht 73.0 in | Wt 368.0 lb

## 2013-08-29 DIAGNOSIS — E119 Type 2 diabetes mellitus without complications: Secondary | ICD-10-CM

## 2013-08-29 LAB — COMPLETE METABOLIC PANEL WITH GFR
Albumin: 3.8 g/dL (ref 3.5–5.2)
BUN: 11 mg/dL (ref 6–23)
CO2: 28 mEq/L (ref 19–32)
Calcium: 9.4 mg/dL (ref 8.4–10.5)
Chloride: 100 mEq/L (ref 96–112)
GFR, Est African American: >89 mL/min
GFR, Est Non African American: >89 mL/min
Glucose, Bld: 254 mg/dL — ABNORMAL HIGH (ref 70–99)
Potassium: 5.1 mEq/L (ref 3.5–5.3)
Sodium: 138 mEq/L (ref 135–145)
Total Protein: 6.9 g/dL (ref 6.0–8.3)

## 2013-08-29 LAB — MICROALBUMIN, URINE: Microalb, Ur: 0.76 mg/dL (ref 0.00–1.89)

## 2013-08-29 LAB — HEMOGLOBIN A1C
Hgb A1c MFr Bld: 8.9 % — ABNORMAL HIGH (ref <5.7)
Mean Plasma Glucose: 209 mg/dL — ABNORMAL HIGH (ref <117)

## 2013-08-29 LAB — LIPID PANEL
Cholesterol: 186 mg/dL (ref 0–200)
Total CHOL/HDL Ratio: 5 Ratio

## 2013-08-29 NOTE — Progress Notes (Signed)
Subjective:    Patient ID: Christopher Spence, male    DOB: August 29, 1961, 52 y.o.   MRN: [id number]  HPI Patient today for follow up of his hypertension hyperlipidemia and diabetes. He's currently taking metformin 1000 mg by mouth twice a day and glipizide 5 mg a day. His last A1c was 6.8. He is morbidly obese and weighs 368 pounds. He's recently resumed exercise.  He is trying to ride his diet. He is interested in some of the medications they can facilitate weight loss. He denies any chest pain shortness of breath or dyspnea on exertion. Past Medical History  Diagnosis Date  . Arthritis     knees  . Personal history of colonic polyps-adenomas 10/15/2012  . Hypertension   . Hyperlipidemia   . Diabetes    Current Outpatient Prescriptions on File Prior to Visit  Medication Sig Dispense Refill  . amLODipine (NORVASC) 10 MG tablet TAKE 1 TABLET BY MOUTH EVERY DAY  30 tablet  6  . fish oil-omega-3 fatty acids 1000 MG capsule Take by mouth daily. Takes 2 capsules daily      . glipiZIDE (GLUCOTROL) 5 MG tablet TAKE 1 TABLET BY MOUTH EVERY DAY  30 tablet  6  . Green Tea, Camillia sinensis, (GREEN TEA PO) Take by mouth. Takes 2 green tea capsules daily      . losartan-hydrochlorothiazide (HYZAAR) 100-12.5 MG per tablet TAKE 1 TABLET EVERY DAY  30 tablet  5  . metFORMIN (GLUCOPHAGE) 1000 MG tablet TAKE 1 TABLET BY MOUTH TWICE A DAY  60 tablet  6  . sildenafil (VIAGRA) 100 MG tablet Take 0.5-1 tablets (50-100 mg total) by mouth daily as needed for erectile dysfunction.  5 tablet  11   No current facility-administered medications on file prior to visit.   No Known Allergies History   Social History  . Marital Status: Married    Spouse Name: N/A    Number of Children: N/A  . Years of Education: N/A   Occupational History  . Not on file.   Social History Main Topics  . Smoking status: Never Smoker   . Smokeless tobacco: Never Used  . Alcohol Use: 21.0 oz/week    35 Cans of beer per week   Comment: 4-5 times weekly,4 to 5 beers  . Drug Use: No  . Sexual Activity: Yes     Comment: married   Other Topics Concern  . Not on file   Social History Narrative  . No narrative on file      Review of Systems  All other systems reviewed and are negative.       Objective:   Physical Exam  Vitals reviewed. Cardiovascular: Normal rate, regular rhythm, normal heart sounds and intact distal pulses.  Exam reveals no gallop and no friction rub.   No murmur heard. Pulmonary/Chest: Effort normal and breath sounds normal. No respiratory distress. He has no wheezes. He has no rales. He exhibits no tenderness.  Abdominal: Soft. Bowel sounds are normal. He exhibits no distension. There is no tenderness. There is no rebound and no guarding.          Assessment & Plan:  1. Type II or unspecified type diabetes mellitus without mention of complication, not stated as uncontrolled Spent 15 minutes discussing therapeutic lifestyle changes to address her obesity and weight loss. Discontinue glipizide. Begin invokana 300 mg poqday.  Recheck hemoglobin A1c in 3 months.  I recommended a flu shot but the patient declined. - COMPLETE METABOLIC  PANEL WITH GFR - Lipid panel - Hemoglobin A1c - Microalbumin, urine

## 2013-09-02 ENCOUNTER — Other Ambulatory Visit: Payer: Self-pay | Admitting: Family Medicine

## 2013-09-02 ENCOUNTER — Encounter: Payer: Self-pay | Admitting: Family Medicine

## 2013-09-02 LAB — HEPATITIS PANEL, ACUTE
HCV Ab: NEGATIVE
Hep B C IgM: NONREACTIVE

## 2013-09-02 NOTE — Telephone Encounter (Signed)
Pt would like for you to call him back with his lab results he states he missed your call on Friday Call back number is [phone]

## 2013-09-03 ENCOUNTER — Encounter: Payer: Self-pay | Admitting: Family Medicine

## 2013-09-03 NOTE — Telephone Encounter (Signed)
[phone] Pt is wanting to understand why exactly he is needing to have an ultrasound

## 2013-09-03 NOTE — Telephone Encounter (Signed)
This encounter was created in error - please disregard.

## 2013-09-04 NOTE — Telephone Encounter (Signed)
This encounter was created in error - please disregard.

## 2013-09-13 ENCOUNTER — Other Ambulatory Visit: Payer: 59

## 2013-09-13 ENCOUNTER — Encounter (INDEPENDENT_AMBULATORY_CARE_PROVIDER_SITE_OTHER): Payer: Self-pay

## 2013-09-13 ENCOUNTER — Telehealth: Payer: Self-pay | Admitting: Family Medicine

## 2013-09-13 LAB — COMPREHENSIVE METABOLIC PANEL
AST: 45 U/L — ABNORMAL HIGH (ref 0–37)
Albumin: 4.4 g/dL (ref 3.5–5.2)
Alkaline Phosphatase: 71 U/L (ref 39–117)
BUN: 17 mg/dL (ref 6–23)
Calcium: 9.8 mg/dL (ref 8.4–10.5)
Chloride: 106 mEq/L (ref 96–112)
Creat: 0.75 mg/dL (ref 0.50–1.35)
Glucose, Bld: 126 mg/dL — ABNORMAL HIGH (ref 70–99)
Potassium: 5.2 mEq/L (ref 3.5–5.3)

## 2013-09-13 NOTE — Telephone Encounter (Signed)
Pt came in and had questions about his blood sugars and diet. Answered all questions and give counseling about carb control with DM. Here is a list of his FBS for your FYI:  [phone]. Pt is taking the Innvokana 300mg  with no side effects.

## 2013-09-13 NOTE — Telephone Encounter (Signed)
Blood sugars are much better.

## 2013-09-22 ENCOUNTER — Other Ambulatory Visit: Payer: Self-pay | Admitting: Family Medicine

## 2013-09-23 ENCOUNTER — Other Ambulatory Visit: Payer: Self-pay | Admitting: Family Medicine

## 2013-10-01 ENCOUNTER — Encounter: Payer: Self-pay | Admitting: Family Medicine

## 2013-10-01 ENCOUNTER — Ambulatory Visit
Admission: RE | Admit: 2013-10-01 | Discharge: 2013-10-01 | Disposition: A | Discharge status: Home / Self Care | Payer: 59 | Source: Ambulatory Visit | Attending: Family Medicine | Admitting: Family Medicine

## 2013-10-01 DIAGNOSIS — R945 Abnormal results of liver function studies: Principal | ICD-10-CM

## 2013-10-01 DIAGNOSIS — R7989 Other specified abnormal findings of blood chemistry: Secondary | ICD-10-CM

## 2013-10-01 MED ORDER — CANAGLIFLOZIN 300 MG PO TABS: 300.0000 mg | ORAL_TABLET | Freq: Every day | ORAL | Status: DC

## 2013-10-04 ENCOUNTER — Encounter: Payer: Self-pay | Admitting: Family Medicine

## 2014-04-05 ENCOUNTER — Other Ambulatory Visit: Payer: Self-pay | Admitting: *Deleted

## 2014-04-05 DIAGNOSIS — I10 Essential (primary) hypertension: Secondary | ICD-10-CM

## 2014-04-05 DIAGNOSIS — E785 Hyperlipidemia, unspecified: Secondary | ICD-10-CM

## 2014-04-05 DIAGNOSIS — E119 Type 2 diabetes mellitus without complications: Secondary | ICD-10-CM

## 2014-04-13 ENCOUNTER — Other Ambulatory Visit: Payer: Self-pay | Admitting: Family Medicine

## 2014-05-09 ENCOUNTER — Other Ambulatory Visit: Payer: 59

## 2014-05-13 ENCOUNTER — Telehealth: Payer: Self-pay | Admitting: *Deleted

## 2014-05-13 MED ORDER — ANTIPYRINE-BENZOCAINE 5.4-1.4 % OT SOLN: 3.0000 [drp] | OTIC | Status: DC | PRN

## 2014-05-13 NOTE — Telephone Encounter (Signed)
okay

## 2014-05-13 NOTE — Telephone Encounter (Signed)
Prescription sent to pharmacy.

## 2014-05-13 NOTE — Telephone Encounter (Signed)
Received fax requesting refill on antipyrine-benzocaine ear drops.   MD please advise.

## 2014-05-16 ENCOUNTER — Ambulatory Visit: Payer: 59 | Admitting: Family Medicine

## 2014-05-18 ENCOUNTER — Other Ambulatory Visit: Payer: Self-pay | Admitting: Family Medicine

## 2014-06-20 IMAGING — US US ABDOMEN COMPLETE
1 series · 14 of 25 positions shown · non-contrast
Comparison: Ultrasound 03/25/2009

CLINICAL DATA: Elevated LFT

EXAM:
ULTRASOUND ABDOMEN COMPLETE

[Series 1: us abdomen complete · 0.38mm/px · 14 of 70 slices shown]
[im 1/70]
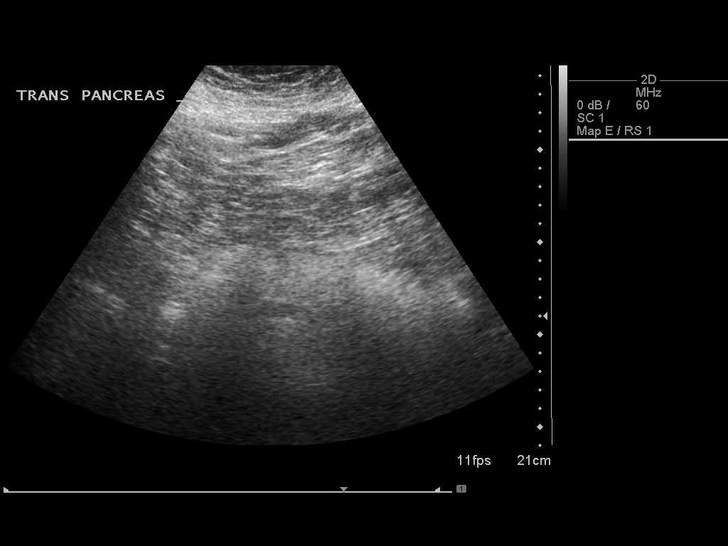
[im 6/70]
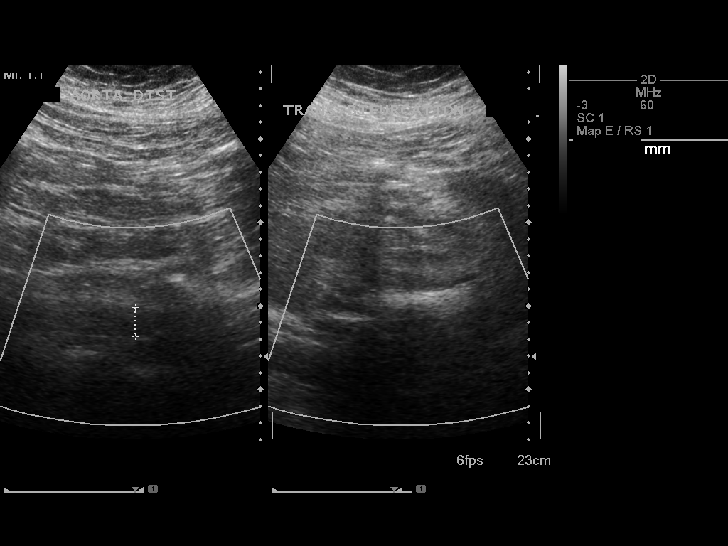
[im 12/70]
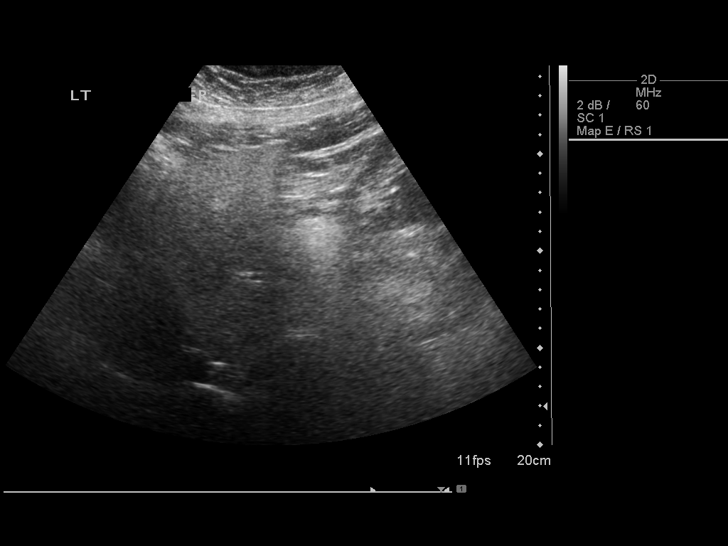
[im 18/70]
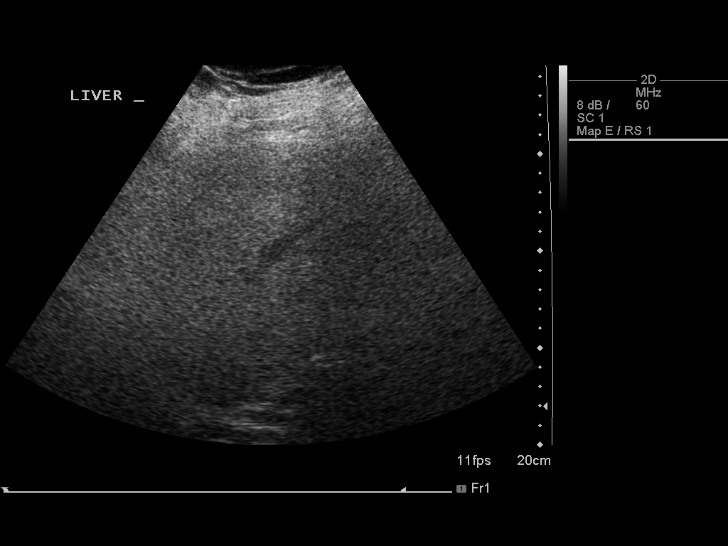
[im 24/70]
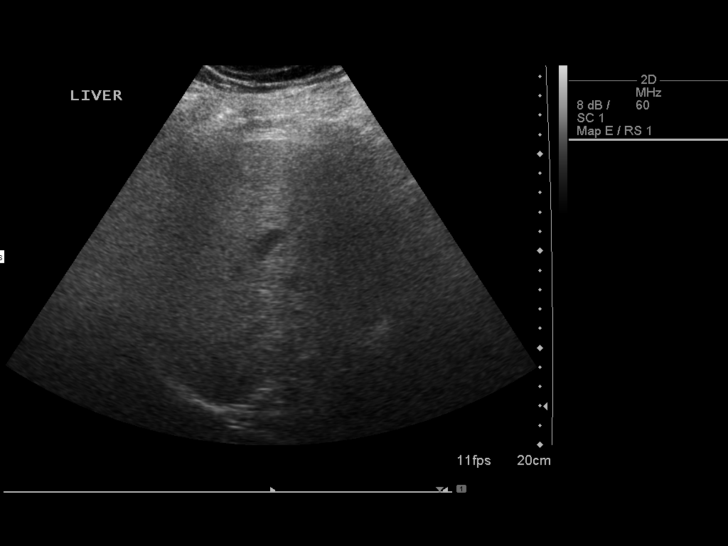
[im 26/70]
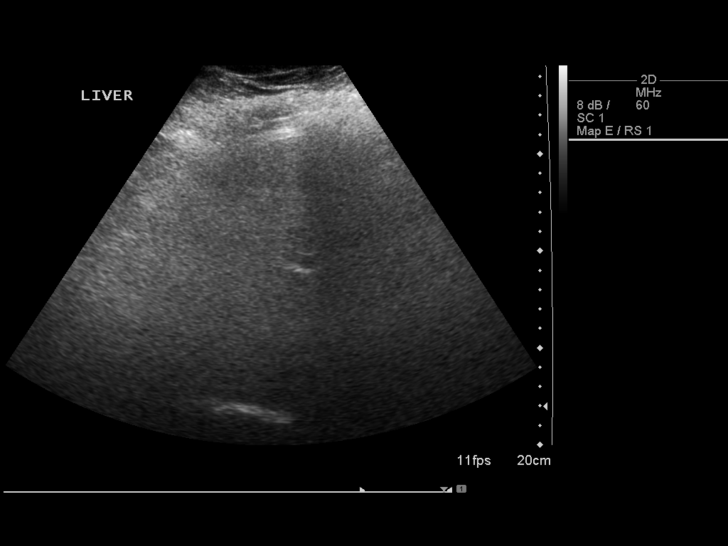
[im 32/70]
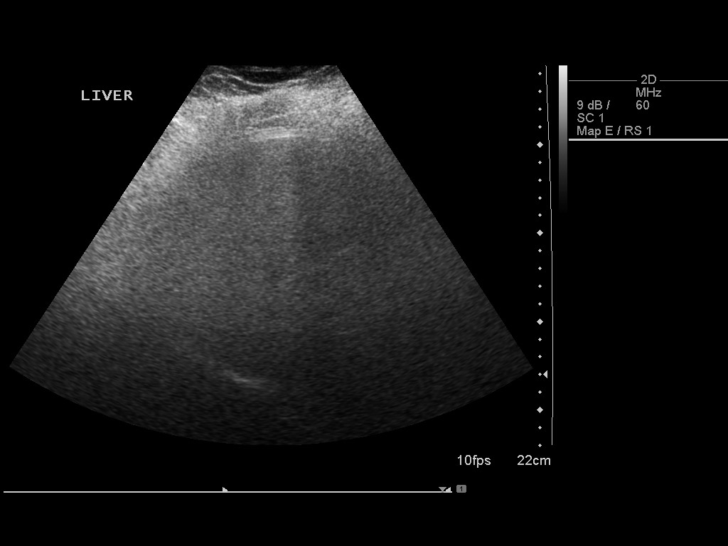
[im 38/70]
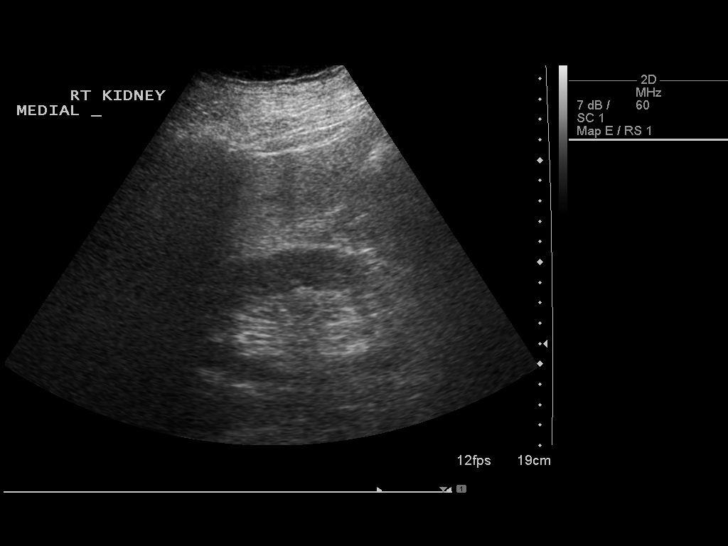
[im 44/70]
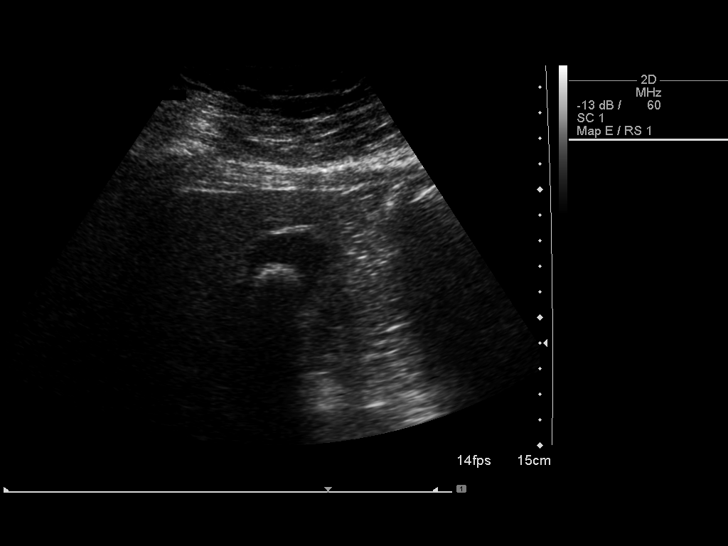
[im 47/70]
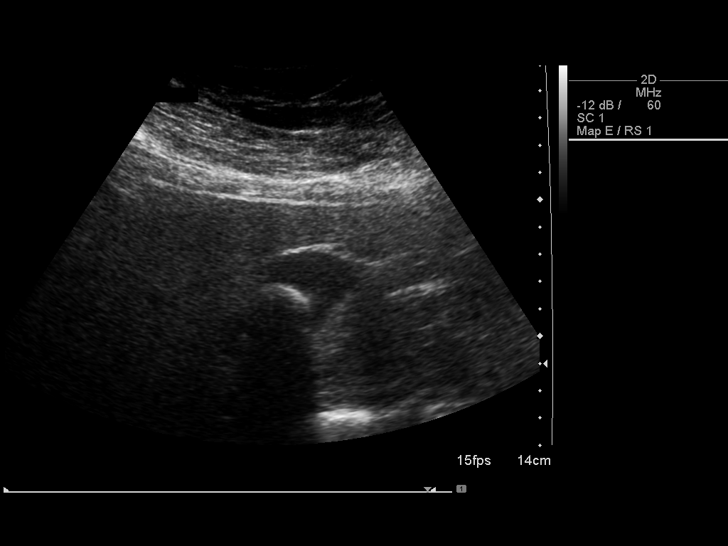
[im 52/70]
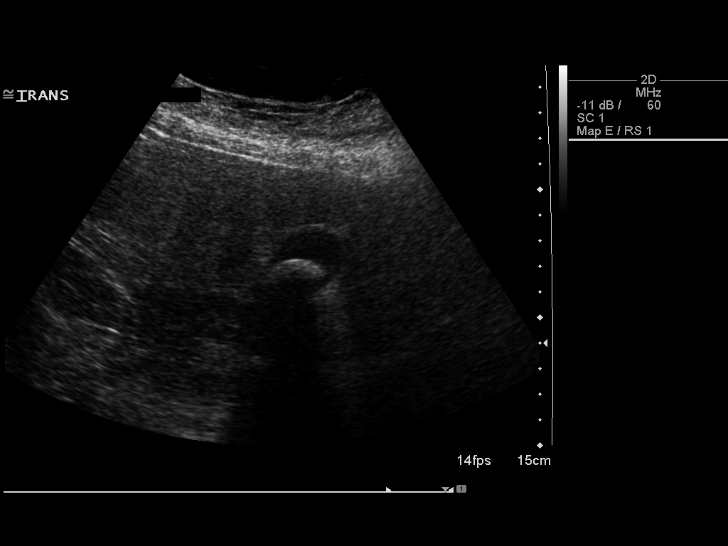
[im 58/70]
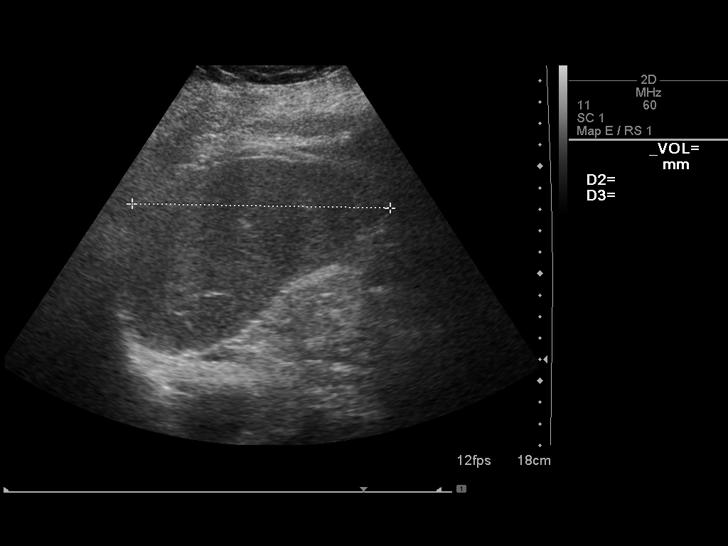
[im 64/70]
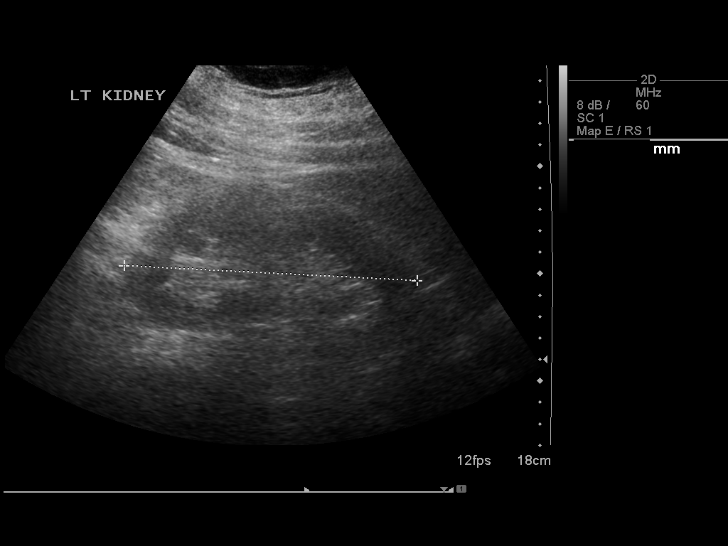
[im 70/70]
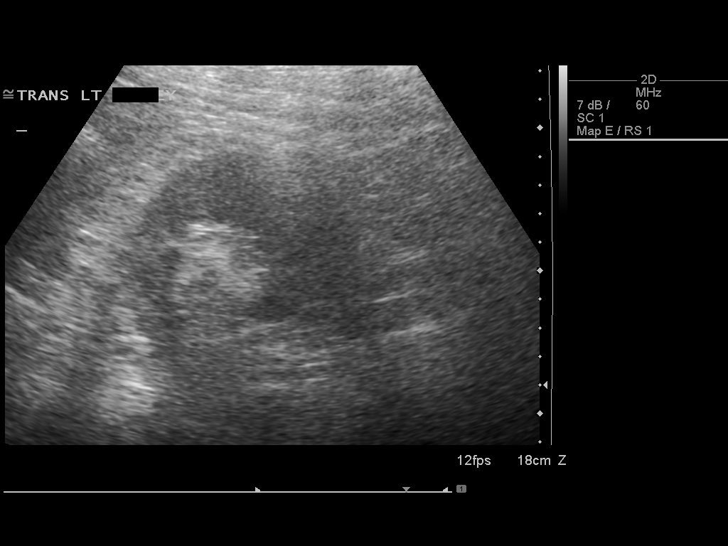

[14 of 25 positions shown; findings below may reference images not displayed]

FINDINGS: Gallbladder:

21 mm large gallstone is present, similar to the prior ultrasound.
Negative for gallbladder wall thickening. Negative sonographic
Murphy sign

Common bile duct:

Diameter: 3.5 mm.

Liver:

Mild hepatomegaly measuring 18.3 cm. Diffusely echogenic liver
suggesting fatty infiltration. No focal mass lesion. Suboptimal
scanning conditions due to large body habitus.

IVC:

Not well visualized due to bowel gas.

Pancreas:

Not well visualized due to bowel gas.

Spleen:

Splenomegaly.  Splenic volume 631 mL.

Right Kidney:

Length: 13 0.3 cm.. Echogenicity within normal limits. No mass or
hydronephrosis visualized.

Left Kidney:

Length: 13.7 cm. Echogenicity within normal limits. No mass or
hydronephrosis visualized.

Abdominal aorta:

Not well seen due to bowel gas.

Other findings:

None.
IMPRESSION: Suboptimal scan due to large patient size and bowel gas.

21 mm gallstone, similar to 7626.  No biliary duct dilatation.

Fatty infiltration of the liver.

Hepatosplenomegaly.

## 2014-06-21 ENCOUNTER — Other Ambulatory Visit: Payer: Self-pay | Admitting: Family Medicine

## 2014-06-30 ENCOUNTER — Other Ambulatory Visit: Payer: Self-pay | Admitting: Family Medicine

## 2014-07-08 ENCOUNTER — Ambulatory Visit (INDEPENDENT_AMBULATORY_CARE_PROVIDER_SITE_OTHER): Payer: 59 | Admitting: Podiatry

## 2014-07-08 ENCOUNTER — Ambulatory Visit (INDEPENDENT_AMBULATORY_CARE_PROVIDER_SITE_OTHER): Payer: 59

## 2014-07-08 ENCOUNTER — Encounter: Payer: Self-pay | Admitting: Podiatry

## 2014-07-08 VITALS — BP 152/95 | HR 88 | Resp 18

## 2014-07-08 DIAGNOSIS — M722 Plantar fascial fibromatosis: Secondary | ICD-10-CM

## 2014-07-08 MED ORDER — TRIAMCINOLONE ACETONIDE 10 MG/ML IJ SUSP
10.0000 mg | Freq: Once | INTRAMUSCULAR | Status: AC
  Administered 2014-07-08: 10 mg

## 2014-07-08 MED ORDER — DICLOFENAC SODIUM 75 MG PO TBEC: 75.0000 mg | DELAYED_RELEASE_TABLET | Freq: Two times a day (BID) | ORAL | Status: DC

## 2014-07-08 NOTE — Progress Notes (Signed)
Subjective:     Patient ID: Christopher Spence, male   DOB: 08/05/1961, 53 y.o.   MRN: [id number]  Foot Pain   patient states I'm getting a lot of pain in my right heel for the last 2 weeks and it's making it very difficult for me to walk or do any type of activity or exercise   Review of Systems  All other systems reviewed and are negative.      Objective:   Physical Exam  Nursing note and vitals reviewed. Constitutional: He is oriented to person, place, and time.  Cardiovascular: Intact distal pulses.   Musculoskeletal: Normal range of motion.  Neurological: He is oriented to person, place, and time.  Skin: Skin is warm.   neurovascular status intact with muscle strength adequate and range of motion subtalar midtarsal joint within normal limits. Patient's noted to have mild equinus and exquisite discomfort to palpation of the medial fascially band right at its insertion into the calcaneus. Moderate depression of the arch good digital perfusion and patient is well oriented x3     Assessment:     Plantar fasciitis of the right heel at the insertion into the calcaneus    Plan:     H&P and x-rays reviewed. Injected the right plantar fascia 3 mg Kenalog 5 mg Xylocaine at its insertion into the calcaneus

## 2014-07-08 NOTE — Progress Notes (Signed)
   Subjective:    Patient ID: Christopher Spence, male    DOB: April 01, 1961, 53 y.o.   MRN: [id number]  HPI Comments: "I have pain in this heel"  Patient c/o sharp plantar heel right for about 2 weeks. He has AM pain. He tried to play golf at the beach last week and was extremely sore. No home treatment.  Foot Pain      Review of Systems  Musculoskeletal: Positive for gait problem.  All other systems reviewed and are negative.      Objective:   Physical Exam        Assessment & Plan:

## 2014-07-08 NOTE — Patient Instructions (Signed)

## 2014-07-16 ENCOUNTER — Ambulatory Visit (INDEPENDENT_AMBULATORY_CARE_PROVIDER_SITE_OTHER): Payer: 59 | Admitting: Podiatry

## 2014-07-16 ENCOUNTER — Encounter: Payer: Self-pay | Admitting: Podiatry

## 2014-07-16 VITALS — BP 160/88 | HR 86 | Resp 16

## 2014-07-16 DIAGNOSIS — M722 Plantar fascial fibromatosis: Secondary | ICD-10-CM

## 2014-07-16 MED ORDER — TRIAMCINOLONE ACETONIDE 10 MG/ML IJ SUSP
10.0000 mg | Freq: Once | INTRAMUSCULAR | Status: AC
  Administered 2014-07-16: 10 mg

## 2014-07-16 NOTE — Progress Notes (Signed)
Subjective:     Patient ID: Christopher Spence, male   DOB: 1961-05-13, 53 y.o.   MRN: [id number]  HPI patient presents stating that I have heel pain still present right that got somewhat better but it is still very tender. I need to be on my feet at work   Review of Systems     Objective:   Physical Exam Neurovascular status intact with pain in the plantar heel right at the insertion of the tendon into the calcaneus and slightly distal    Assessment:     Plantar fasciitis of the right heel    Plan:     Instructed on continued physical therapy I reinjected the plantar fascia 3 mg Kenalog 5 mg Xylocaine and I went ahead today and I scanned for custom orthotics to reduce stress against his feet

## 2014-07-19 ENCOUNTER — Other Ambulatory Visit: Payer: Self-pay | Admitting: Family Medicine

## 2014-07-21 ENCOUNTER — Encounter: Payer: Self-pay | Admitting: Family Medicine

## 2014-08-08 ENCOUNTER — Other Ambulatory Visit: Payer: 59

## 2014-09-04 ENCOUNTER — Other Ambulatory Visit: Payer: Self-pay | Admitting: Family Medicine

## 2014-09-04 NOTE — Telephone Encounter (Signed)
Medication filled x1 with no refills.   Requires office visit before any further refills can be given.   Letter sent.  

## 2014-09-21 ENCOUNTER — Other Ambulatory Visit: Payer: Self-pay | Admitting: Family Medicine

## 2014-09-21 NOTE — Telephone Encounter (Signed)
Medication filled x1 with no refills.   Requires office visit before any further refills can be given.   Letter sent.  

## 2014-10-06 ENCOUNTER — Ambulatory Visit (INDEPENDENT_AMBULATORY_CARE_PROVIDER_SITE_OTHER): Payer: 59 | Admitting: Family Medicine

## 2014-10-06 ENCOUNTER — Encounter: Payer: Self-pay | Admitting: Family Medicine

## 2014-10-06 ENCOUNTER — Other Ambulatory Visit: Payer: Self-pay | Admitting: Family Medicine

## 2014-10-06 VITALS — BP 142/90 | HR 88 | Temp 98.2°F | Resp 20 | Ht 73.0 in | Wt 359.0 lb

## 2014-10-06 DIAGNOSIS — I10 Essential (primary) hypertension: Secondary | ICD-10-CM

## 2014-10-06 DIAGNOSIS — Z23 Encounter for immunization: Secondary | ICD-10-CM

## 2014-10-06 DIAGNOSIS — K644 Residual hemorrhoidal skin tags: Secondary | ICD-10-CM

## 2014-10-06 DIAGNOSIS — E1165 Type 2 diabetes mellitus with hyperglycemia: Secondary | ICD-10-CM

## 2014-10-06 DIAGNOSIS — IMO0002 Reserved for concepts with insufficient information to code with codable children: Secondary | ICD-10-CM

## 2014-10-06 DIAGNOSIS — I781 Nevus, non-neoplastic: Secondary | ICD-10-CM

## 2014-10-06 LAB — COMPLETE METABOLIC PANEL WITH GFR
ALT: 69 U/L — ABNORMAL HIGH (ref 0–53)
AST: 23 U/L (ref 0–37)
Albumin: 4.1 g/dL (ref 3.5–5.2)
Alkaline Phosphatase: 75 U/L (ref 39–117)
BUN: 20 mg/dL (ref 6–23)
CHLORIDE: 102 meq/L (ref 96–112)
CO2: 30 mEq/L (ref 19–32)
Calcium: 9.4 mg/dL (ref 8.4–10.5)
Creat: 0.79 mg/dL (ref 0.50–1.35)
Glucose, Bld: 132 mg/dL — ABNORMAL HIGH (ref 70–99)
POTASSIUM: 4.7 meq/L (ref 3.5–5.3)
Sodium: 138 mEq/L (ref 135–145)
Total Bilirubin: 1 mg/dL (ref 0.2–1.2)
Total Protein: 6.9 g/dL (ref 6.0–8.3)

## 2014-10-06 LAB — LIPID PANEL
CHOL/HDL RATIO: 3.9 ratio
Cholesterol: 188 mg/dL (ref 0–200)
HDL: 48 mg/dL (ref >39)
LDL CALC: 99 mg/dL (ref 0–99)
TRIGLYCERIDES: 204 mg/dL — AB (ref <150)
VLDL: 41 mg/dL — AB (ref 0–40)

## 2014-10-06 LAB — HEMOGLOBIN A1C
Hgb A1c MFr Bld: 6.5 % — ABNORMAL HIGH (ref <5.7)
MEAN PLASMA GLUCOSE: 140 mg/dL — AB (ref <117)

## 2014-10-06 LAB — MICROALBUMIN, URINE: Microalb, Ur: 1.1 mg/dL (ref <2.0)

## 2014-10-06 NOTE — Progress Notes (Signed)
Subjective:    Patient ID: Christopher Spence, male    DOB: [birth year], 54 y.o.   MRN: [id number]  HPI Patient is here today for follow-up of his hypertension hyperlipidemia and diabetes. He is taking an aspirin 81 mg every day. He is recently started exercising and he has lost 10 pounds from his last office visit a year ago. He is due for Pneumovax 23. He is also due for diabetic foot exam as well as a diabetic eye exam. Unfortunately he continues to drink heavily. He has no desire to quit drinking at the present time. He also has a skin tag on his right eyelid he is requesting excision. I perform this using a pair of scissors and forceps without anesthesia and hemostasis was attained using Drysol. The patient tolerated the procedure well without complications. Past Medical History  Diagnosis Date  . Arthritis     knees  . Personal history of colonic polyps-adenomas 10/15/2012  . Hypertension   . Hyperlipidemia   . Diabetes    Past Surgical History  Procedure Laterality Date  . Umbilical hernia repair      x 2  . Knee arthrotomy  1976    right  . Knee arthroscopy      bilateral; twice on each knee  . Colonoscopy  10/15/2012    Procedure: COLONOSCOPY;  Surgeon: Gatha Mayer, MD;  Location: [facility];  Service: Endoscopy;  Laterality: N/A;   Current Outpatient Prescriptions on File Prior to Visit  Medication Sig Dispense Refill  . amLODipine (NORVASC) 10 MG tablet TAKE 1 TABLET BY MOUTH EVERY DAY 30 tablet 5  . aspirin 81 MG tablet Take 81 mg by mouth daily.    . diclofenac (VOLTAREN) 75 MG EC tablet Take 1 tablet (75 mg total) by mouth 2 (two) times daily. 50 tablet 2  . fish oil-omega-3 fatty acids 1000 MG capsule Take by mouth daily. Takes 2 capsules daily    . glipiZIDE (GLUCOTROL) 5 MG tablet TAKE 1 TABLET BY MOUTH EVERY DAY 30 tablet 0  . Green Tea, Camillia sinensis, (GREEN TEA PO) Take by mouth. Takes 2 green tea capsules daily    . INVOKANA 300 MG TABS tablet TAKE 1 TABLET  BY MOUTH DAILY 30 tablet 0  . losartan-hydrochlorothiazide (HYZAAR) 100-12.5 MG per tablet TAKE 1 TABLET EVERY DAY 30 tablet 5  . metFORMIN (GLUCOPHAGE) 1000 MG tablet TAKE 1 TABLET BY MOUTH TWICE A DAY 60 tablet 0  . VIAGRA 100 MG tablet TAKE 0.5-1 TABLETS (50-100 MG TOTAL) BY MOUTH DAILY AS NEEDED FOR ERECTILE DYSFUNCTION. 5 tablet 1   No current facility-administered medications on file prior to visit.   No Known Allergies History   Social History  . Marital Status: Married    Spouse Name: N/A    Number of Children: N/A  . Years of Education: N/A   Occupational History  . Not on file.   Social History Main Topics  . Smoking status: Never Smoker   . Smokeless tobacco: Never Used  . Alcohol Use: 21.0 oz/week    35 Cans of beer per week     Comment: 4-5 times weekly,4 to 5 beers  . Drug Use: No  . Sexual Activity: Yes     Comment: married   Other Topics Concern  . Not on file   Social History Narrative      Review of Systems  All other systems reviewed and are negative.      Objective:  Physical Exam  Constitutional: He appears well-developed and well-nourished.  Neck: Neck supple. No JVD present. No thyromegaly present.  Cardiovascular: Normal rate, regular rhythm, normal heart sounds and intact distal pulses.   No murmur heard. Pulmonary/Chest: Effort normal and breath sounds normal. No respiratory distress. He has no wheezes. He has no rales.  Abdominal: Soft. Bowel sounds are normal. He exhibits no distension. There is no tenderness. There is no rebound and no guarding.  Musculoskeletal: He exhibits edema.  Lymphadenopathy:    He has no cervical adenopathy.  Vitals reviewed.         Assessment & Plan:  Essential hypertension - Plan: COMPLETE METABOLIC PANEL WITH GFR, Lipid panel  Diabetes mellitus type II, uncontrolled - Plan: COMPLETE METABOLIC PANEL WITH GFR, Lipid panel, Hemoglobin A1c, Microalbumin, urine, Ambulatory referral to Ophthalmology,  Pneumococcal polysaccharide vaccine 23-valent greater than or equal to 2yo subcutaneous/IM  Need for prophylactic vaccination against Streptococcus pneumoniae (pneumococcus) - Plan: Pneumococcal polysaccharide vaccine 23-valent greater than or equal to 2yo subcutaneous/IM  Patient is morbidly obese. I recommended diet exercise and weight loss. I also recommended complete cessation of alcohol consumption. Patient received his Pneumovax 23 today. Diabetic foot exam was performed. I will check a CMP, fasting lipid panel, and hemoglobin A1c, and urine microalbumin. Patient's blood pressure is elevated but the patient is adamant that his blood pressure is much better at home. I'm also concerned about obstructive sleep apnea given his body size. However the patient is completely asymptomatic and does not want to go for a sleep study at the present time. I will schedule the patient for diabetic eye exam

## 2014-10-06 NOTE — Telephone Encounter (Signed)
Refill appropriate and filled per protocol. 

## 2014-10-07 ENCOUNTER — Encounter: Payer: Self-pay | Admitting: Family Medicine

## 2014-10-27 ENCOUNTER — Other Ambulatory Visit: Payer: Self-pay | Admitting: Family Medicine

## 2014-10-28 NOTE — Telephone Encounter (Signed)
Medication refilled per protocol. 

## 2014-11-03 ENCOUNTER — Other Ambulatory Visit: Payer: Self-pay | Admitting: Family Medicine

## 2014-12-31 ENCOUNTER — Other Ambulatory Visit: Payer: Self-pay | Admitting: Family Medicine

## 2015-01-19 ENCOUNTER — Other Ambulatory Visit: Payer: Self-pay | Admitting: Family Medicine

## 2015-02-13 ENCOUNTER — Telehealth: Payer: Self-pay | Admitting: *Deleted

## 2015-02-13 MED ORDER — METFORMIN HCL 1000 MG PO TABS: 1000.0000 mg | ORAL_TABLET | Freq: Two times a day (BID) | ORAL | Status: DC

## 2015-02-13 NOTE — Telephone Encounter (Signed)
Received call from [place] at [facility] stating they have sent in refill request for metformin and has not heard anything yet. I informed pharmacist that I will authorize one month supply only and that pt needs f/u office visit before further refills.  Sent was verbally prescribed and placed in EPIC

## 2015-02-25 ENCOUNTER — Other Ambulatory Visit: Payer: Self-pay | Admitting: Family Medicine

## 2015-02-25 NOTE — Telephone Encounter (Signed)
Refill appropriate and filled per protocol. 

## 2015-03-03 ENCOUNTER — Other Ambulatory Visit: Payer: Self-pay | Admitting: Family Medicine

## 2015-03-09 ENCOUNTER — Other Ambulatory Visit: Payer: Self-pay | Admitting: Family Medicine

## 2015-03-09 NOTE — Telephone Encounter (Signed)
Refill appropriate and filled per protocol. 

## 2015-04-18 ENCOUNTER — Other Ambulatory Visit: Payer: Self-pay | Admitting: Family Medicine

## 2015-04-20 ENCOUNTER — Encounter: Payer: Self-pay | Admitting: Family Medicine

## 2015-04-20 NOTE — Telephone Encounter (Signed)
Letter will be sent to pt for OV

## 2015-05-11 ENCOUNTER — Other Ambulatory Visit: Payer: 59

## 2015-05-11 DIAGNOSIS — E785 Hyperlipidemia, unspecified: Secondary | ICD-10-CM

## 2015-05-11 DIAGNOSIS — I10 Essential (primary) hypertension: Secondary | ICD-10-CM

## 2015-05-11 DIAGNOSIS — E119 Type 2 diabetes mellitus without complications: Secondary | ICD-10-CM

## 2015-05-11 LAB — CBC WITH DIFFERENTIAL/PLATELET
BASOS PCT: 0 % (ref 0–1)
Basophils Absolute: 0 10*3/uL (ref 0.0–0.1)
EOS PCT: 1 % (ref 0–5)
Eosinophils Absolute: 0.1 10*3/uL (ref 0.0–0.7)
HCT: 50.6 % (ref 39.0–52.0)
HEMOGLOBIN: 16.7 g/dL (ref 13.0–17.0)
Lymphocytes Relative: 33 % (ref 12–46)
Lymphs Abs: 1.9 10*3/uL (ref 0.7–4.0)
MCH: 29.6 pg (ref 26.0–34.0)
MCHC: 33 g/dL (ref 30.0–36.0)
MCV: 89.6 fL (ref 78.0–100.0)
MPV: 11.1 fL (ref 8.6–12.4)
Monocytes Absolute: 0.5 10*3/uL (ref 0.1–1.0)
Monocytes Relative: 9 % (ref 3–12)
NEUTROS ABS: 3.3 10*3/uL (ref 1.7–7.7)
Neutrophils Relative %: 57 % (ref 43–77)
Platelets: 193 10*3/uL (ref 150–400)
RBC: 5.65 MIL/uL (ref 4.22–5.81)
RDW: 13.3 % (ref 11.5–15.5)
WBC: 5.8 10*3/uL (ref 4.0–10.5)

## 2015-05-11 LAB — COMPLETE METABOLIC PANEL WITH GFR
ALT: 83 U/L — ABNORMAL HIGH (ref 9–46)
AST: 41 U/L — AB (ref 10–35)
Albumin: 4 g/dL (ref 3.6–5.1)
Alkaline Phosphatase: 61 U/L (ref 40–115)
BUN: 15 mg/dL (ref 7–25)
CO2: 28 mmol/L (ref 20–31)
Calcium: 9.8 mg/dL (ref 8.6–10.3)
Chloride: 102 mmol/L (ref 98–110)
Creat: 0.76 mg/dL (ref 0.70–1.33)
GFR, Est African American: >89 mL/min (ref >=60)
GFR, Est Non African American: >89 mL/min (ref >=60)
GLUCOSE: 131 mg/dL — AB (ref 70–99)
POTASSIUM: 4.6 mmol/L (ref 3.5–5.3)
SODIUM: 141 mmol/L (ref 135–146)
Total Bilirubin: 1 mg/dL (ref 0.2–1.2)
Total Protein: 7 g/dL (ref 6.1–8.1)

## 2015-05-11 LAB — LIPID PANEL
CHOL/HDL RATIO: 3.6 ratio (ref <=5.0)
Cholesterol: 172 mg/dL (ref 125–200)
HDL: 48 mg/dL (ref >=40)
LDL Cholesterol: 80 mg/dL (ref <130)
TRIGLYCERIDES: 221 mg/dL — AB (ref <150)
VLDL: 44 mg/dL — ABNORMAL HIGH (ref <30)

## 2015-05-12 ENCOUNTER — Other Ambulatory Visit: Payer: 59

## 2015-05-12 LAB — HEMOGLOBIN A1C
Hgb A1c MFr Bld: 6.6 % — ABNORMAL HIGH (ref <5.7)
Mean Plasma Glucose: 143 mg/dL — ABNORMAL HIGH (ref <117)

## 2015-05-15 ENCOUNTER — Other Ambulatory Visit: Payer: Self-pay | Admitting: Family Medicine

## 2015-05-25 ENCOUNTER — Ambulatory Visit (INDEPENDENT_AMBULATORY_CARE_PROVIDER_SITE_OTHER): Payer: 59 | Admitting: Family Medicine

## 2015-05-25 ENCOUNTER — Encounter: Payer: Self-pay | Admitting: Family Medicine

## 2015-05-25 VITALS — BP 122/78 | HR 88 | Temp 98.8°F | Resp 16 | Ht 73.0 in | Wt 356.0 lb

## 2015-05-25 DIAGNOSIS — E785 Hyperlipidemia, unspecified: Secondary | ICD-10-CM

## 2015-05-25 DIAGNOSIS — IMO0002 Reserved for concepts with insufficient information to code with codable children: Secondary | ICD-10-CM

## 2015-05-25 DIAGNOSIS — E1165 Type 2 diabetes mellitus with hyperglycemia: Secondary | ICD-10-CM

## 2015-05-25 DIAGNOSIS — I10 Essential (primary) hypertension: Secondary | ICD-10-CM | POA: Diagnosis not present

## 2015-05-25 DIAGNOSIS — K760 Fatty (change of) liver, not elsewhere classified: Secondary | ICD-10-CM | POA: Diagnosis not present

## 2015-05-25 NOTE — Progress Notes (Signed)
Subjective:    Patient ID: Christopher Spence, male    DOB: 05/04/1961, 54 y.o.   MRN: [id number]  HPI 10/06/14 Patient is here today for follow-up of his hypertension hyperlipidemia and diabetes. He is taking an aspirin 81 mg every day. He is recently started exercising and he has lost 10 pounds from his last office visit a year ago. He is due for Pneumovax 23. He is also due for diabetic foot exam as well as a diabetic eye exam. Unfortunately he continues to drink heavily. He has no desire to quit drinking at the present time. He also has a skin tag on his right eyelid he is requesting excision. I perform this using a pair of scissors and forceps without anesthesia and hemostasis was attained using Drysol. The patient tolerated the procedure well without complications.  At that time, my plan was:  Patient is morbidly obese. I recommended diet exercise and weight loss. I also recommended complete cessation of alcohol consumption. Patient received his Pneumovax 23 today. Diabetic foot exam was performed. I will check a CMP, fasting lipid panel, and hemoglobin A1c, and urine microalbumin. Patient's blood pressure is elevated but the patient is adamant that his blood pressure is much better at home. I'm also concerned about obstructive sleep apnea given his body size. However the patient is completely asymptomatic and does not want to go for a sleep study at the present time. I will schedule the patient for diabetic eye exam  05/25/15  patient's most recent lab work as listed below: Appointment on 05/11/2015  Component Date Value Ref Range Status  . WBC 05/11/2015 5.8  4.0 - 10.5 K/uL Final  . RBC 05/11/2015 5.65  4.22 - 5.81 MIL/uL Final  . Hemoglobin 05/11/2015 16.7  13.0 - 17.0 g/dL Final  . HCT 05/11/2015 50.6  39.0 - 52.0 % Final  . MCV 05/11/2015 89.6  78.0 - 100.0 fL Final  . MCH 05/11/2015 29.6  26.0 - 34.0 pg Final  . MCHC 05/11/2015 33.0  30.0 - 36.0 g/dL Final  . RDW 05/11/2015 13.3  11.5 -  15.5 % Final  . Platelets 05/11/2015 193  150 - 400 K/uL Final  . MPV 05/11/2015 11.1  8.6 - 12.4 fL Final  . Neutrophils Relative % 05/11/2015 57  43 - 77 % Final  . Neutro Abs 05/11/2015 3.3  1.7 - 7.7 K/uL Final  . Lymphocytes Relative 05/11/2015 33  12 - 46 % Final  . Lymphs Abs 05/11/2015 1.9  0.7 - 4.0 K/uL Final  . Monocytes Relative 05/11/2015 9  3 - 12 % Final  . Monocytes Absolute 05/11/2015 0.5  0.1 - 1.0 K/uL Final  . Eosinophils Relative 05/11/2015 1  0 - 5 % Final  . Eosinophils Absolute 05/11/2015 0.1  0.0 - 0.7 K/uL Final  . Basophils Relative 05/11/2015 0  0 - 1 % Final  . Basophils Absolute 05/11/2015 0.0  0.0 - 0.1 K/uL Final  . Smear Review 05/11/2015 Criteria for review not met   Final  . Sodium 05/11/2015 141  135 - 146 mmol/L Final  . Potassium 05/11/2015 4.6  3.5 - 5.3 mmol/L Final  . Chloride 05/11/2015 102  98 - 110 mmol/L Final  . CO2 05/11/2015 28  20 - 31 mmol/L Final  . Glucose, Bld 05/11/2015 131* 70 - 99 mg/dL Final  . BUN 05/11/2015 15  7 - 25 mg/dL Final  . Creat 05/11/2015 0.76  0.70 - 1.33 mg/dL Final  . Total  Bilirubin 05/11/2015 1.0  0.2 - 1.2 mg/dL Final  . Alkaline Phosphatase 05/11/2015 61  40 - 115 U/L Final  . AST 05/11/2015 41* 10 - 35 U/L Final  . ALT 05/11/2015 83* 9 - 46 U/L Final  . Total Protein 05/11/2015 7.0  6.1 - 8.1 g/dL Final  . Albumin 05/11/2015 4.0  3.6 - 5.1 g/dL Final  . Calcium 05/11/2015 9.8  8.6 - 10.3 mg/dL Final  . GFR, Est African American 05/11/2015 >89  >=60 mL/min Final  . GFR, Est Non African American 05/11/2015 >89  >=60 mL/min Final   Comment:   The estimated GFR is a calculation valid for adults (>=79 years old) that uses the CKD-EPI algorithm to adjust for age and sex. It is   not to be used for children, pregnant women, hospitalized patients,    patients on dialysis, or with rapidly changing kidney function. According to the NKDEP, eGFR >89 is normal, 60-89 shows mild impairment, 30-59 shows moderate  impairment, 15-29 shows severe impairment and <15 is ESRD.     Marland Kitchen Hgb A1c MFr Bld 05/11/2015 6.6* <5.7 % Final   Comment:                                                                        According to the ADA Clinical Practice Recommendations for 2011, when HbA1c is used as a screening test:     >=6.5%   Diagnostic of Diabetes Mellitus            (if abnormal result is confirmed)   5.7-6.4%   Increased risk of developing Diabetes Mellitus   References:Diagnosis and Classification of Diabetes Mellitus,Diabetes IDPO,2423,53(IRWER 1):S62-S69 and Standards of Medical Care in         Diabetes - 2011,Diabetes XVQM,0867,61 (Suppl 1):S11-S61.     . Mean Plasma Glucose 05/11/2015 143* <117 mg/dL Final   Comment:   Footnotes:  (1) ** Please note change in unit of measure and reference range(s). **     . Cholesterol 05/11/2015 172  125 - 200 mg/dL Final  . Triglycerides 05/11/2015 221* <150 mg/dL Final  . HDL 05/11/2015 48  >=40 mg/dL Final  . Total CHOL/HDL Ratio 05/11/2015 3.6  <=5.0 Ratio Final  . VLDL 05/11/2015 44* <30 mg/dL Final  . LDL Cholesterol 05/11/2015 80  <130 mg/dL Final   Comment:   Total Cholesterol/HDL Ratio:CHD Risk                        Coronary Heart Disease Risk Table                                        Men       Women          1/2 Average Risk              3.4        3.3              Average Risk              5.0        4.4  2X Average Risk              9.6        7.1           3X Average Risk             23.4       11.0 Use the calculated Patient Ratio above and the CHD Risk table  to determine the patient's CHD Risk.     Patient has only lost 3 pounds since his last office visit. He is not exercising. He continues to drink  6-8 beers a day. He is also using a carb rich diet. He denies any chest pain shortness of breath or dyspnea on exertion. I am very confident that the patient has sleep apnea. Again he refuses a split-level sleep study to  evaluate for obstructive sleep apnea. Past Medical History  Diagnosis Date  . Arthritis     knees  . Personal history of colonic polyps-adenomas 10/15/2012  . Hypertension   . Hyperlipidemia   . Diabetes    Past Surgical History  Procedure Laterality Date  . Umbilical hernia repair      x 2  . Knee arthrotomy  1976    right  . Knee arthroscopy      bilateral; twice on each knee  . Colonoscopy  10/15/2012    Procedure: COLONOSCOPY;  Surgeon: Gatha Mayer, MD;  Location: [facility];  Service: Endoscopy;  Laterality: N/A;   Current Outpatient Prescriptions on File Prior to Visit  Medication Sig Dispense Refill  . amLODipine (NORVASC) 10 MG tablet TAKE 1 TABLET BY MOUTH EVERY DAY 30 tablet 3  . aspirin 81 MG tablet Take 81 mg by mouth daily.    . diclofenac (VOLTAREN) 75 MG EC tablet Take 1 tablet (75 mg total) by mouth 2 (two) times daily. 50 tablet 2  . fish oil-omega-3 fatty acids 1000 MG capsule Take by mouth daily. Takes 2 capsules daily    . glipiZIDE (GLUCOTROL) 5 MG tablet TAKE 1 TABLET BY MOUTH EVERY DAY 30 tablet 1  . Green Tea, Camillia sinensis, (GREEN TEA PO) Take by mouth. Takes 2 green tea capsules daily    . INVOKANA 300 MG TABS tablet TAKE 1 TABLET BY MOUTH EVERY DAY 30 tablet 2  . losartan-hydrochlorothiazide (HYZAAR) 100-12.5 MG per tablet TAKE 1 TABLET BY MOUTH DAILY 30 tablet 3  . metFORMIN (GLUCOPHAGE) 1000 MG tablet TAKE 1 TABLET (1,000 MG TOTAL) BY MOUTH 2 (TWO) TIMES DAILY WITH A MEAL. 60 tablet 2  . VIAGRA 100 MG tablet TAKE 0.5-1 TABLETS (50-100 MG TOTAL) BY MOUTH DAILY AS NEEDED FOR ERECTILE DYSFUNCTION. 5 tablet 1   No current facility-administered medications on file prior to visit.   No Known Allergies Social History   Social History  . Marital Status: Married    Spouse Name: N/A  . Number of Children: N/A  . Years of Education: N/A   Occupational History  . Not on file.   Social History Main Topics  . Smoking status: Never Smoker   .  Smokeless tobacco: Never Used  . Alcohol Use: 21.0 oz/week    35 Cans of beer per week     Comment: 4-5 times weekly,4 to 5 beers  . Drug Use: No  . Sexual Activity: Yes     Comment: married   Other Topics Concern  . Not on file   Social History Narrative      Review of Systems  All other systems reviewed and are negative.      Objective:   Physical Exam  Constitutional: He appears well-developed and well-nourished.  Neck: Neck supple. No JVD present. No thyromegaly present.  Cardiovascular: Normal rate, regular rhythm, normal heart sounds and intact distal pulses.   No murmur heard. Pulmonary/Chest: Effort normal and breath sounds normal. No respiratory distress. He has no wheezes. He has no rales.  Abdominal: Soft. Bowel sounds are normal. He exhibits no distension. There is no tenderness. There is no rebound and no guarding.  Musculoskeletal: He exhibits edema.  Lymphadenopathy:    He has no cervical adenopathy.  Vitals reviewed.         Assessment & Plan:  Essential hypertension  Diabetes mellitus type II, uncontrolled  Fatty liver  Dyslipidemia  blood pressure is adequately controlled. Diabetes is fair. Triglycerides are elevated but the remainder of his cholesterol is acceptable. However at a long discussion today with the patient regarding his weight. He needs to abstain from alcohol. He start exercising 30 minutes a day 5 days a week. We need to set a six-month goal of trying to get his weight below 300 pounds.   Otherwise I believe the patient will have a very high risk of death within the next 5 years due to cardiovascular complications. Also suggested a sleep study  To evaluate for sleep apnea but he declined.

## 2015-06-05 ENCOUNTER — Other Ambulatory Visit: Payer: Self-pay | Admitting: Family Medicine

## 2015-06-05 NOTE — Telephone Encounter (Signed)
Medication refilled per protocol. 

## 2015-08-11 ENCOUNTER — Other Ambulatory Visit: Payer: Self-pay | Admitting: Family Medicine

## 2015-08-27 ENCOUNTER — Encounter: Payer: Self-pay | Admitting: Family Medicine

## 2015-08-27 ENCOUNTER — Other Ambulatory Visit: Payer: Self-pay | Admitting: Family Medicine

## 2015-08-27 NOTE — Telephone Encounter (Signed)
Medication refill for one time only.  Patient needs to be seen.  Letter sent for patient to call and schedule 

## 2015-09-23 ENCOUNTER — Other Ambulatory Visit: Payer: Self-pay | Admitting: Family Medicine

## 2015-10-26 ENCOUNTER — Encounter: Payer: Self-pay | Admitting: Internal Medicine

## 2015-12-19 ENCOUNTER — Other Ambulatory Visit: Payer: Self-pay | Admitting: Family Medicine

## 2015-12-26 ENCOUNTER — Other Ambulatory Visit: Payer: Self-pay | Admitting: Family Medicine

## 2015-12-28 NOTE — Telephone Encounter (Signed)
Refill appropriate and filled per protocol. 

## 2016-01-29 DIAGNOSIS — G8929 Other chronic pain: Secondary | ICD-10-CM | POA: Insufficient documentation

## 2016-02-14 ENCOUNTER — Other Ambulatory Visit: Payer: Self-pay | Admitting: Family Medicine

## 2016-03-30 ENCOUNTER — Other Ambulatory Visit: Payer: Self-pay | Admitting: Family Medicine

## 2016-03-30 ENCOUNTER — Encounter: Payer: Self-pay | Admitting: Family Medicine

## 2016-04-16 ENCOUNTER — Other Ambulatory Visit: Payer: Self-pay | Admitting: Family Medicine

## 2016-05-02 ENCOUNTER — Other Ambulatory Visit: Payer: Self-pay | Admitting: Family Medicine

## 2016-05-11 ENCOUNTER — Encounter: Payer: Self-pay | Admitting: Family Medicine

## 2016-05-11 ENCOUNTER — Ambulatory Visit (INDEPENDENT_AMBULATORY_CARE_PROVIDER_SITE_OTHER): Payer: 59 | Admitting: Family Medicine

## 2016-05-11 ENCOUNTER — Telehealth: Payer: Self-pay | Admitting: Family Medicine

## 2016-05-11 VITALS — BP 132/90 | HR 98 | Temp 98.9°F | Resp 22 | Ht 73.0 in | Wt 349.0 lb

## 2016-05-11 DIAGNOSIS — H60333 Swimmer's ear, bilateral: Secondary | ICD-10-CM | POA: Diagnosis not present

## 2016-05-11 MED ORDER — CIPROFLOXACIN-DEXAMETHASONE 0.3-0.1 % OT SUSP: 4.0000 [drp] | Freq: Two times a day (BID) | OTIC | 1 refills | Status: DC

## 2016-05-11 NOTE — Telephone Encounter (Signed)
Pt has called and left several messages??  Says left ear is painful and swollen shut.  Offered him appt today but he declined.  Said found some "old" ear drops around the house that he is using and says feels better today.  Has appt next week for CPE.  If ear worsens before then will call back for sick visit

## 2016-05-11 NOTE — Patient Instructions (Signed)
F/U as previous for physical and labs

## 2016-05-11 NOTE — Progress Notes (Signed)
   Subjective:    Patient ID: Christopher Spence, male    DOB: [birth year], 55 y.o.   MRN: [id number]  Patient presents for Other (Left ear swelling and pain) Patient here with left ear swelling and pain, He has history of recurrent swimmer's ear the last bout was about 2 years ago. He has had this since childhood. He often goes to the beach and gets water in his ears. He's had some mild discomfort in the right one. He did use some old draws they had expired in 2015 but again some mild relief. He has not had any fever denies any other URI symptoms He does have underlying diabetes mellitus hypertension he is due for physical exam next week and will have fasting labs.    Review Of Systems: per above   GEN- denies fatigue, fever, weight loss,weakness, recent illness HEENT- denies eye drainage, change in vision, nasal discharge, CVS- denies chest pain, palpitations RESP- denies SOB, cough, wheeze Neuro- denies headache, dizziness, syncope, seizure activity       Objective:    BP 132/90   Pulse 98   Temp 98.9 F (37.2 C) (Oral)   Resp (!) 22   Ht 6\' 1"  (1.854 m)   Wt (!) 349 lb (158.3 kg)   BMI 46.04 kg/m  GEN- NAD, alert and oriented x3 HEENT- PERRL, EOMI, non injected sclera, pink conjunctiva, MMM, oropharynx clear, Right canal mild erythema, no swelling no discharge, TM clear, left canal swelling, erythema, no discharge noted, obscurred TM due to swelling no fluid seen  Neck- Supple, shotty left post auricular node         Assessment & Plan:      Problem List Items Addressed This Visit    Swimmer's ear - Primary    Ciprodex to bilat ears, given refill due to recurrence  F/u as scheduled for physical       Other Visit Diagnoses   None.     Note: This dictation was prepared with Dragon dictation along with smaller phrase technology. Any transcriptional errors that result from this process are unintentional.

## 2016-05-11 NOTE — Assessment & Plan Note (Signed)
Ciprodex to bilat ears, given refill due to recurrence  F/u as scheduled for physical

## 2016-05-12 NOTE — Telephone Encounter (Signed)
Give this message to shannon to determine where messages are going.  I hoped the new phone system would eliminate this.

## 2016-05-16 ENCOUNTER — Other Ambulatory Visit: Payer: Self-pay | Admitting: Family Medicine

## 2016-05-16 ENCOUNTER — Other Ambulatory Visit: Payer: 59

## 2016-05-16 DIAGNOSIS — E785 Hyperlipidemia, unspecified: Secondary | ICD-10-CM

## 2016-05-16 DIAGNOSIS — E119 Type 2 diabetes mellitus without complications: Secondary | ICD-10-CM

## 2016-05-16 DIAGNOSIS — I10 Essential (primary) hypertension: Secondary | ICD-10-CM

## 2016-05-16 LAB — CBC WITH DIFFERENTIAL/PLATELET
BASOS ABS: 0 {cells}/uL (ref 0–200)
Basophils Relative: 0 %
EOS ABS: 64 {cells}/uL (ref 15–500)
Eosinophils Relative: 1 %
HEMATOCRIT: 48.9 % (ref 38.5–50.0)
Hemoglobin: 16.1 g/dL (ref 13.0–17.0)
LYMPHS PCT: 29 %
Lymphs Abs: 1856 cells/uL (ref 850–3900)
MCH: 29.4 pg (ref 27.0–33.0)
MCHC: 32.9 g/dL (ref 32.0–36.0)
MCV: 89.4 fL (ref 80.0–100.0)
MONO ABS: 768 {cells}/uL (ref 200–950)
MONOS PCT: 12 %
MPV: 10.9 fL (ref 7.5–12.5)
Neutro Abs: 3712 cells/uL (ref 1500–7800)
Neutrophils Relative %: 58 %
PLATELETS: 223 10*3/uL (ref 140–400)
RBC: 5.47 MIL/uL (ref 4.20–5.80)
RDW: 13.2 % (ref 11.0–15.0)
WBC: 6.4 10*3/uL (ref 3.8–10.8)

## 2016-05-16 LAB — COMPLETE METABOLIC PANEL WITH GFR
ALBUMIN: 3.9 g/dL (ref 3.6–5.1)
ALK PHOS: 65 U/L (ref 40–115)
ALT: 63 U/L — AB (ref 9–46)
AST: 24 U/L (ref 10–35)
BUN: 16 mg/dL (ref 7–25)
CALCIUM: 9.4 mg/dL (ref 8.6–10.3)
CO2: 27 mmol/L (ref 20–31)
CREATININE: 0.76 mg/dL (ref 0.70–1.33)
Chloride: 101 mmol/L (ref 98–110)
GFR, Est African American: >89 mL/min (ref >=60)
GFR, Est Non African American: >89 mL/min (ref >=60)
GLUCOSE: 159 mg/dL — AB (ref 70–99)
Potassium: 4.7 mmol/L (ref 3.5–5.3)
SODIUM: 138 mmol/L (ref 135–146)
TOTAL PROTEIN: 6.5 g/dL (ref 6.1–8.1)
Total Bilirubin: 0.8 mg/dL (ref 0.2–1.2)

## 2016-05-16 LAB — LIPID PANEL
Cholesterol: 175 mg/dL (ref 125–200)
HDL: 51 mg/dL (ref >=40)
LDL Cholesterol: 78 mg/dL (ref <130)
Total CHOL/HDL Ratio: 3.4 Ratio (ref <=5.0)
Triglycerides: 230 mg/dL — ABNORMAL HIGH (ref <150)
VLDL: 46 mg/dL — AB (ref <30)

## 2016-05-16 MED ORDER — METFORMIN HCL 1000 MG PO TABS: ORAL_TABLET | ORAL | 3 refills | Status: DC

## 2016-05-17 LAB — HEMOGLOBIN A1C
HEMOGLOBIN A1C: 6.2 % — AB (ref <5.7)
Mean Plasma Glucose: 131 mg/dL

## 2016-05-24 ENCOUNTER — Other Ambulatory Visit: Payer: Self-pay | Admitting: Family Medicine

## 2016-05-24 ENCOUNTER — Ambulatory Visit (INDEPENDENT_AMBULATORY_CARE_PROVIDER_SITE_OTHER): Payer: 59 | Admitting: Family Medicine

## 2016-05-24 ENCOUNTER — Encounter: Payer: Self-pay | Admitting: Family Medicine

## 2016-05-24 VITALS — BP 134/82 | HR 82 | Temp 97.9°F | Resp 16 | Ht 73.0 in | Wt 353.0 lb

## 2016-05-24 DIAGNOSIS — E785 Hyperlipidemia, unspecified: Secondary | ICD-10-CM | POA: Diagnosis not present

## 2016-05-24 DIAGNOSIS — K760 Fatty (change of) liver, not elsewhere classified: Secondary | ICD-10-CM

## 2016-05-24 DIAGNOSIS — E119 Type 2 diabetes mellitus without complications: Secondary | ICD-10-CM

## 2016-05-24 DIAGNOSIS — Z0000 Encounter for general adult medical examination without abnormal findings: Secondary | ICD-10-CM | POA: Diagnosis not present

## 2016-05-24 DIAGNOSIS — I10 Essential (primary) hypertension: Secondary | ICD-10-CM | POA: Diagnosis not present

## 2016-05-24 MED ORDER — METFORMIN HCL 1000 MG PO TABS: ORAL_TABLET | ORAL | 3 refills | Status: DC

## 2016-05-24 MED ORDER — CANAGLIFLOZIN 300 MG PO TABS: ORAL_TABLET | ORAL | 1 refills | Status: DC

## 2016-05-24 MED ORDER — LOSARTAN POTASSIUM-HCTZ 100-12.5 MG PO TABS: 1.0000 | ORAL_TABLET | Freq: Every day | ORAL | 1 refills | Status: DC

## 2016-05-24 MED ORDER — AMLODIPINE BESYLATE 10 MG PO TABS: 10.0000 mg | ORAL_TABLET | Freq: Every day | ORAL | 1 refills | Status: DC

## 2016-05-24 MED ORDER — GLIPIZIDE 5 MG PO TABS: 5.0000 mg | ORAL_TABLET | Freq: Every day | ORAL | 1 refills | Status: DC

## 2016-05-24 NOTE — Progress Notes (Signed)
Subjective:    Patient ID: Christopher Spence, male    DOB: 04-24-1961, 55 y.o.   MRN: [id number]  HPI1/11/16 Patient is here today for follow-up of his hypertension hyperlipidemia and diabetes. He is taking an aspirin 81 mg every day. He is recently started exercising and he has lost 10 pounds from his last office visit a year ago. He is due for Pneumovax 23. He is also due for diabetic foot exam as well as a diabetic eye exam. Unfortunately he continues to drink heavily. He has no desire to quit drinking at the present time. He also has a skin tag on his right eyelid he is requesting excision. I perform this using a pair of scissors and forceps without anesthesia and hemostasis was attained using Drysol. The patient tolerated the procedure well without complications.  At that time, my plan was:  Patient is morbidly obese. I recommended diet exercise and weight loss. I also recommended complete cessation of alcohol consumption. Patient received his Pneumovax 23 today. Diabetic foot exam was performed. I will check a CMP, fasting lipid panel, and hemoglobin A1c, and urine microalbumin. Patient's blood pressure is elevated but the patient is adamant that his blood pressure is much better at home. I'm also concerned about obstructive sleep apnea given his body size. However the patient is completely asymptomatic and does not want to go for a sleep study at the present time. I will schedule the patient for diabetic eye exam  05/25/15  Patient has only lost 3 pounds since his last office visit. He is not exercising. He continues to drink  6-8 beers a day. He is also using a carb rich diet. He denies any chest pain shortness of breath or dyspnea on exertion. I am very confident that the patient has sleep apnea. Again he refuses a split-level sleep study to evaluate for obstructive sleep apnea. At that time, my plan was: blood pressure is adequately controlled. Diabetes is fair. Triglycerides are elevated but the  remainder of his cholesterol is acceptable. However at a long discussion today with the patient regarding his weight. He needs to abstain from alcohol. He start exercising 30 minutes a day 5 days a week. We need to set a six-month goal of trying to get his weight below 300 pounds.   Otherwise I believe the patient will have a very high risk of death within the next 5 years due to cardiovascular complications. Also suggested a sleep study  To evaluate for sleep apnea but he declined.  05/24/16 I have not seen the patient since. Patient is here today for complete physical exam. His most recent lab work as listed below: Appointment on 05/16/2016  Component Date Value Ref Range Status  . WBC 05/16/2016 6.4  3.8 - 10.8 K/uL Final  . RBC 05/16/2016 5.47  4.20 - 5.80 MIL/uL Final  . Hemoglobin 05/16/2016 16.1  13.0 - 17.0 g/dL Final  . HCT 05/16/2016 48.9  38.5 - 50.0 % Final  . MCV 05/16/2016 89.4  80.0 - 100.0 fL Final  . MCH 05/16/2016 29.4  27.0 - 33.0 pg Final  . MCHC 05/16/2016 32.9  32.0 - 36.0 g/dL Final  . RDW 05/16/2016 13.2  11.0 - 15.0 % Final  . Platelets 05/16/2016 223  140 - 400 K/uL Final  . MPV 05/16/2016 10.9  7.5 - 12.5 fL Final  . Neutro Abs 05/16/2016 3712  1,500 - 7,800 cells/uL Final  . Lymphs Abs 05/16/2016 1856  850 - 3,900 cells/uL  Final  . Monocytes Absolute 05/16/2016 768  200 - 950 cells/uL Final  . Eosinophils Absolute 05/16/2016 64  15 - 500 cells/uL Final  . Basophils Absolute 05/16/2016 0  0 - 200 cells/uL Final  . Neutrophils Relative % 05/16/2016 58  % Final  . Lymphocytes Relative 05/16/2016 29  % Final  . Monocytes Relative 05/16/2016 12  % Final  . Eosinophils Relative 05/16/2016 1  % Final  . Basophils Relative 05/16/2016 0  % Final  . Smear Review 05/16/2016 Criteria for review not met   Final  . Sodium 05/16/2016 138  135 - 146 mmol/L Final  . Potassium 05/16/2016 4.7  3.5 - 5.3 mmol/L Final  . Chloride 05/16/2016 101  98 - 110 mmol/L Final  . CO2  05/16/2016 27  20 - 31 mmol/L Final  . Glucose, Bld 05/16/2016 159* 70 - 99 mg/dL Final  . BUN 05/16/2016 16  7 - 25 mg/dL Final  . Creat 05/16/2016 0.76  0.70 - 1.33 mg/dL Final   Comment:   For patients > or = 57 years of age: The upper reference limit for Creatinine is approximately 13% higher for people identified as African-American.     . Total Bilirubin 05/16/2016 0.8  0.2 - 1.2 mg/dL Final  . Alkaline Phosphatase 05/16/2016 65  40 - 115 U/L Final  . AST 05/16/2016 24  10 - 35 U/L Final  . ALT 05/16/2016 63* 9 - 46 U/L Final  . Total Protein 05/16/2016 6.5  6.1 - 8.1 g/dL Final  . Albumin 05/16/2016 3.9  3.6 - 5.1 g/dL Final  . Calcium 05/16/2016 9.4  8.6 - 10.3 mg/dL Final  . GFR, Est African American 05/16/2016 >89  >=60 mL/min Final  . GFR, Est Non African American 05/16/2016 >89  >=60 mL/min Final  . Hgb A1c MFr Bld 05/17/2016 6.2* <5.7 % Final   Comment:   For someone without known diabetes, a hemoglobin A1c value between 5.7% and 6.4% is consistent with prediabetes and should be confirmed with a follow-up test.   For someone with known diabetes, a value <7% indicates that their diabetes is well controlled. A1c targets should be individualized based on duration of diabetes, age, co-morbid conditions and other considerations.   This assay result is consistent with an increased risk of diabetes.   Currently, no consensus exists regarding use of hemoglobin A1c for diagnosis of diabetes in children.     . Mean Plasma Glucose 05/17/2016 131  mg/dL Final  . Cholesterol 05/16/2016 175  125 - 200 mg/dL Final  . Triglycerides 05/16/2016 230* <150 mg/dL Final  . HDL 05/16/2016 51  >=40 mg/dL Final  . Total CHOL/HDL Ratio 05/16/2016 3.4  <=5.0 Ratio Final  . VLDL 05/16/2016 46* <30 mg/dL Final  . LDL Cholesterol 05/16/2016 78  <130 mg/dL Final   Comment:   Total Cholesterol/HDL Ratio:CHD Risk                        Coronary Heart Disease Risk Table                                         Men       Women          1/2 Average Risk              3.4  3.3              Average Risk              5.0        4.4           2X Average Risk              9.6        7.1           3X Average Risk             23.4       11.0 Use the calculated Patient Ratio above and the CHD Risk table  to determine the patient's CHD Risk.    Patient's last colonoscopy was in 2014. It was significant for 3 polyps per his report. He is due for digital rectal exam. He is due for a PSA. He is due for hepatitis C screening. He is not exercising. He is not following any specific diet. His weight is essentially unchanged from last year. Immunizations are up-to-date. He has had Pneumovax 23. He is not in at age to have the shingles vaccine or an Prevnar 13 yet. Past Medical History:  Diagnosis Date  . Arthritis    knees  . Diabetes ([place])   . Hyperlipidemia   . Hypertension   . Personal history of colonic polyps-adenomas 10/15/2012   Past Surgical History:  Procedure Laterality Date  . COLONOSCOPY  10/15/2012   Procedure: COLONOSCOPY;  Surgeon: Gatha Mayer, MD;  Location: [facility];  Service: Endoscopy;  Laterality: N/A;  . KNEE ARTHROSCOPY     bilateral; twice on each knee  . KNEE ARTHROTOMY  1976   right  . UMBILICAL HERNIA REPAIR     x 2   Current Outpatient Prescriptions on File Prior to Visit  Medication Sig Dispense Refill  . aspirin 81 MG tablet Take 81 mg by mouth daily.    . fish oil-omega-3 fatty acids 1000 MG capsule Take by mouth daily. Takes 2 capsules daily    . Green Tea, Camillia sinensis, (GREEN TEA PO) Take by mouth. Takes 2 green tea capsules daily    . VIAGRA 100 MG tablet TAKE 0.5-1 TABLETS (50-100 MG TOTAL) BY MOUTH DAILY AS NEEDED FOR ERECTILE DYSFUNCTION. 5 tablet 1   No current facility-administered medications on file prior to visit.    No Known Allergies Social History   Social History  . Marital status: Married    Spouse name: N/A  . Number  of children: N/A  . Years of education: N/A   Occupational History  . Not on file.   Social History Main Topics  . Smoking status: Never Smoker  . Smokeless tobacco: Never Used  . Alcohol use 21.0 oz/week    35 Cans of beer per week     Comment: 4-5 times weekly,4 to 5 beers  . Drug use: No  . Sexual activity: Yes     Comment: married   Other Topics Concern  . Not on file   Social History Narrative  . No narrative on file   Family History  Problem Relation Age of Onset  . Colon cancer Neg Hx   . Stomach cancer Neg Hx   . Cancer Mother     breast  . Cancer Father       Review of Systems  All other systems reviewed and are negative.      Objective:   Physical Exam  Constitutional:  He is oriented to person, place, and time. He appears well-developed and well-nourished. No distress.  HENT:  Head: Normocephalic and atraumatic.  Right Ear: External ear normal.  Left Ear: External ear normal.  Nose: Nose normal.  Mouth/Throat: Oropharynx is clear and moist. No oropharyngeal exudate.  Eyes: Conjunctivae and EOM are normal. Pupils are equal, round, and reactive to light. Right eye exhibits no discharge. Left eye exhibits no discharge. No scleral icterus.  Neck: Neck supple. No JVD present. No tracheal deviation present. No thyromegaly present.  Cardiovascular: Normal rate, regular rhythm, normal heart sounds and intact distal pulses.  Exam reveals no gallop and no friction rub.   No murmur heard. Pulmonary/Chest: Effort normal and breath sounds normal. No stridor. No respiratory distress. He has no wheezes. He has no rales. He exhibits no tenderness.  Abdominal: Soft. Bowel sounds are normal. He exhibits no distension and no mass. There is no tenderness. There is no rebound and no guarding.  Genitourinary: Rectum normal and prostate normal.  Musculoskeletal: He exhibits edema.  Lymphadenopathy:    He has no cervical adenopathy.  Neurological: He is alert and oriented  to person, place, and time. He has normal reflexes. He displays normal reflexes. No cranial nerve deficit. He exhibits normal muscle tone. Coordination normal.  Skin: Skin is warm. No rash noted. He is not diaphoretic. No erythema. No pallor.  Psychiatric: He has a normal mood and affect. His behavior is normal. Judgment and thought content normal.  Vitals reviewed.  Wt Readings from Last 3 Encounters:  05/24/16 (!) 353 lb (160.1 kg)  05/11/16 (!) 349 lb (158.3 kg)  05/25/15 (!) 356 lb (161.5 kg)          Assessment & Plan:  Essential hypertension  Controlled type 2 diabetes mellitus without complication, without long-term current use of insulin (HCC)  Fatty liver  Dyslipidemia  Routine general medical examination at a health care facility - Plan: Hepatitis C Ab Reflex HCV RNA, QUANT, PSA  His blood pressure today is well controlled. His diabetes is adequately controlled with an A1c of 6.2. His LDL cholesterol is less than 100. However I'm still very concerned about this patient's health. I explained to the patient that even though his blood work looks relatively good I feel that he is a unhealthy individual. We have got to get him to lose substantial weight, at least 50 pounds. I am convinced that he has sleep apnea but he continues to refuse a sleep study. I recommended 30 minutes of aerobic exercise 5 days a week. I recommended restricting his calories to less than 1500 cal a day. I recommended a low saturated fat low-carb diet. I recommended abstinence from alcohol. I will screen the patient with a PSA and hepatitis C screening test. Recheck in 3-6 months. Cancer screening is up-to-date. Immunizations are up-to-date. Eye exam is up-to-date

## 2016-05-25 LAB — HEPATITIS C ANTIBODY: HCV AB: NEGATIVE

## 2016-05-25 LAB — PSA: PSA: 1.5 ng/mL (ref <=4.0)

## 2016-06-22 ENCOUNTER — Other Ambulatory Visit: Payer: Self-pay | Admitting: Family Medicine

## 2016-06-22 MED ORDER — CANAGLIFLOZIN 300 MG PO TABS: ORAL_TABLET | ORAL | 1 refills | Status: DC

## 2016-07-20 ENCOUNTER — Other Ambulatory Visit: Payer: Self-pay | Admitting: Family Medicine

## 2016-10-18 ENCOUNTER — Other Ambulatory Visit: Payer: Self-pay | Admitting: Family Medicine

## 2016-11-17 ENCOUNTER — Other Ambulatory Visit: Payer: Self-pay | Admitting: Family Medicine

## 2016-12-15 ENCOUNTER — Other Ambulatory Visit: Payer: Self-pay | Admitting: Family Medicine

## 2017-02-17 ENCOUNTER — Other Ambulatory Visit: Payer: Self-pay | Admitting: Family Medicine

## 2017-03-21 ENCOUNTER — Other Ambulatory Visit: Payer: Self-pay | Admitting: Family Medicine

## 2017-04-23 ENCOUNTER — Other Ambulatory Visit: Payer: Self-pay | Admitting: Family Medicine

## 2017-05-24 ENCOUNTER — Other Ambulatory Visit: Payer: Self-pay | Admitting: Family Medicine

## 2017-06-23 ENCOUNTER — Other Ambulatory Visit: Payer: Self-pay | Admitting: Family Medicine

## 2017-06-28 ENCOUNTER — Other Ambulatory Visit: Payer: Self-pay

## 2017-06-30 ENCOUNTER — Other Ambulatory Visit: Payer: Self-pay

## 2017-06-30 MED ORDER — SILDENAFIL CITRATE 100 MG PO TABS: ORAL_TABLET | ORAL | 1 refills | Status: DC

## 2017-07-23 ENCOUNTER — Other Ambulatory Visit: Payer: Self-pay | Admitting: Family Medicine

## 2017-07-24 ENCOUNTER — Encounter: Payer: Self-pay | Admitting: Family Medicine

## 2017-07-31 ENCOUNTER — Other Ambulatory Visit: Payer: 59

## 2017-07-31 DIAGNOSIS — E785 Hyperlipidemia, unspecified: Secondary | ICD-10-CM

## 2017-07-31 DIAGNOSIS — E119 Type 2 diabetes mellitus without complications: Secondary | ICD-10-CM

## 2017-07-31 DIAGNOSIS — I10 Essential (primary) hypertension: Secondary | ICD-10-CM

## 2017-08-01 LAB — CBC WITH DIFFERENTIAL/PLATELET
Basophils Absolute: 52 cells/uL (ref 0–200)
Basophils Relative: 0.8 %
EOS PCT: 1.9 %
Eosinophils Absolute: 124 cells/uL (ref 15–500)
HCT: 49.5 % (ref 38.5–50.0)
Hemoglobin: 16.7 g/dL (ref 13.2–17.1)
Lymphs Abs: 2022 cells/uL (ref 850–3900)
MCH: 29.6 pg (ref 27.0–33.0)
MCHC: 33.7 g/dL (ref 32.0–36.0)
MCV: 87.8 fL (ref 80.0–100.0)
MPV: 11.6 fL (ref 7.5–12.5)
Monocytes Relative: 11.5 %
NEUTROS PCT: 54.7 %
Neutro Abs: 3556 cells/uL (ref 1500–7800)
PLATELETS: 201 10*3/uL (ref 140–400)
RBC: 5.64 10*6/uL (ref 4.20–5.80)
RDW: 12.1 % (ref 11.0–15.0)
TOTAL LYMPHOCYTE: 31.1 %
WBC mixed population: 748 cells/uL (ref 200–950)
WBC: 6.5 10*3/uL (ref 3.8–10.8)

## 2017-08-01 LAB — COMPLETE METABOLIC PANEL WITH GFR
AG Ratio: 1.6 (calc) (ref 1.0–2.5)
ALT: 71 U/L — AB (ref 9–46)
AST: 30 U/L (ref 10–35)
Albumin: 4.2 g/dL (ref 3.6–5.1)
Alkaline phosphatase (APISO): 70 U/L (ref 40–115)
BUN/Creatinine Ratio: 23 (calc) — ABNORMAL HIGH (ref 6–22)
BUN: 16 mg/dL (ref 7–25)
CALCIUM: 9.8 mg/dL (ref 8.6–10.3)
CO2: 27 mmol/L (ref 20–32)
CREATININE: 0.69 mg/dL — AB (ref 0.70–1.33)
Chloride: 104 mmol/L (ref 98–110)
GFR, EST NON AFRICAN AMERICAN: 105 mL/min/{1.73_m2} (ref >=60)
GFR, Est African American: 122 mL/min/{1.73_m2} (ref >=60)
GLUCOSE: 138 mg/dL — AB (ref 65–99)
Globulin: 2.7 g/dL (calc) (ref 1.9–3.7)
POTASSIUM: 5.4 mmol/L — AB (ref 3.5–5.3)
Sodium: 139 mmol/L (ref 135–146)
Total Bilirubin: 0.9 mg/dL (ref 0.2–1.2)
Total Protein: 6.9 g/dL (ref 6.1–8.1)

## 2017-08-01 LAB — LIPID PANEL
Cholesterol: 188 mg/dL (ref <200)
HDL: 51 mg/dL (ref >40)
LDL Cholesterol (Calc): 103 mg/dL (calc) — ABNORMAL HIGH
NON-HDL CHOLESTEROL (CALC): 137 mg/dL — AB (ref <130)
TRIGLYCERIDES: 218 mg/dL — AB (ref <150)
Total CHOL/HDL Ratio: 3.7 (calc) (ref <5.0)

## 2017-08-01 LAB — HEMOGLOBIN A1C
HEMOGLOBIN A1C: 6.4 %{Hb} — AB (ref <5.7)
Mean Plasma Glucose: 137 (calc)
eAG (mmol/L): 7.6 (calc)

## 2017-08-03 ENCOUNTER — Encounter: Payer: Self-pay | Admitting: Family Medicine

## 2017-08-03 ENCOUNTER — Ambulatory Visit (INDEPENDENT_AMBULATORY_CARE_PROVIDER_SITE_OTHER): Payer: 59 | Admitting: Family Medicine

## 2017-08-03 VITALS — BP 160/88 | HR 92 | Temp 98.4°F | Resp 18 | Ht 73.0 in | Wt 345.0 lb

## 2017-08-03 DIAGNOSIS — I10 Essential (primary) hypertension: Secondary | ICD-10-CM

## 2017-08-03 DIAGNOSIS — Z0000 Encounter for general adult medical examination without abnormal findings: Secondary | ICD-10-CM

## 2017-08-03 DIAGNOSIS — Z23 Encounter for immunization: Secondary | ICD-10-CM

## 2017-08-03 DIAGNOSIS — E785 Hyperlipidemia, unspecified: Secondary | ICD-10-CM

## 2017-08-03 DIAGNOSIS — K760 Fatty (change of) liver, not elsewhere classified: Secondary | ICD-10-CM

## 2017-08-03 DIAGNOSIS — E119 Type 2 diabetes mellitus without complications: Secondary | ICD-10-CM

## 2017-08-03 MED ORDER — DOXAZOSIN MESYLATE 4 MG PO TABS: 4.0000 mg | ORAL_TABLET | Freq: Every day | ORAL | 3 refills | Status: DC

## 2017-08-03 NOTE — Addendum Note (Signed)
Addended by: Shary Decamp B on: 08/03/2017 12:16 PM   Modules accepted: Orders

## 2017-08-03 NOTE — Progress Notes (Signed)
Subjective:    Patient ID: Christopher Spence, male    DOB: [birth year], 58 y.o.   MRN: [id number]  HPI1/11/16 Patient is here today for follow-up of his hypertension hyperlipidemia and diabetes. He is taking an aspirin 81 mg every day. He is recently started exercising and he has lost 10 pounds from his last office visit a year ago. He is due for Pneumovax 23. He is also due for diabetic foot exam as well as a diabetic eye exam. Unfortunately he continues to drink heavily. He has no desire to quit drinking at the present time. He also has a skin tag on his right eyelid he is requesting excision. I perform this using a pair of scissors and forceps without anesthesia and hemostasis was attained using Drysol. The patient tolerated the procedure well without complications.  At that time, my plan was:  Patient is morbidly obese. I recommended diet exercise and weight loss. I also recommended complete cessation of alcohol consumption. Patient received his Pneumovax 23 today. Diabetic foot exam was performed. I will check a CMP, fasting lipid panel, and hemoglobin A1c, and urine microalbumin. Patient's blood pressure is elevated but the patient is adamant that his blood pressure is much better at home. I'm also concerned about obstructive sleep apnea given his body size. However the patient is completely asymptomatic and does not want to go for a sleep study at the present time. I will schedule the patient for diabetic eye exam  05/25/15  Patient has only lost 3 pounds since his last office visit. He is not exercising. He continues to drink  6-8 beers a day. He is also using a carb rich diet. He denies any chest pain shortness of breath or dyspnea on exertion. I am very confident that the patient has sleep apnea. Again he refuses a split-level sleep study to evaluate for obstructive sleep apnea. At that time, my plan was: blood pressure is adequately controlled. Diabetes is fair. Triglycerides are elevated but the  remainder of his cholesterol is acceptable. However at a long discussion today with the patient regarding his weight. He needs to abstain from alcohol. He start exercising 30 minutes a day 5 days a week. We need to set a six-month goal of trying to get his weight below 300 pounds.   Otherwise I believe the patient will have a very high risk of death within the next 5 years due to cardiovascular complications. Also suggested a sleep study  To evaluate for sleep apnea but he declined.  05/24/16 I have not seen the patient since. Patient is here today for complete physical exam. His most recent lab work as listed below: Patient's last colonoscopy was in 2014. It was significant for 3 polyps per his report. He is due for digital rectal exam. He is due for a PSA. He is due for hepatitis C screening. He is not exercising. He is not following any specific diet. His weight is essentially unchanged from last year. Immunizations are up-to-date. He has had Pneumovax 23. He is not in at age to have the shingles vaccine or an Prevnar 13 yet.  At that time, my plan was: His blood pressure today is well controlled. His diabetes is adequately controlled with an A1c of 6.2. His LDL cholesterol is less than 100. However I'm still very concerned about this patient's health. I explained to the patient that even though his blood work looks relatively good I feel that he is a unhealthy individual. We have got  to get him to lose substantial weight, at least 50 pounds. I am convinced that he has sleep apnea but he continues to refuse a sleep study. I recommended 30 minutes of aerobic exercise 5 days a week. I recommended restricting his calories to less than 1500 cal a day. I recommended a low saturated fat low-carb diet. I recommended abstinence from alcohol. I will screen the patient with a PSA and hepatitis C screening test. Recheck in 3-6 months. Cancer screening is up-to-date. Immunizations are up-to-date. Eye exam is  up-to-date  08/03/17 Patient is here for CPE.  Patient has not been seen in 1 year.  Weight has not changed substantially Wt Readings from Last 3 Encounters:  08/03/17 (!) 345 lb (156.5 kg)  05/24/16 (!) 353 lb (160.1 kg)  05/11/16 (!) 349 lb (158.3 kg)   Most recent labs are listed below: Appointment on 07/31/2017  Component Date Value Ref Range Status  . WBC 07/31/2017 6.5  3.8 - 10.8 Thousand/uL Final  . RBC 07/31/2017 5.64  4.20 - 5.80 Million/uL Final  . Hemoglobin 07/31/2017 16.7  13.2 - 17.1 g/dL Final  . HCT 07/31/2017 49.5  38.5 - 50.0 % Final  . MCV 07/31/2017 87.8  80.0 - 100.0 fL Final  . MCH 07/31/2017 29.6  27.0 - 33.0 pg Final  . MCHC 07/31/2017 33.7  32.0 - 36.0 g/dL Final  . RDW 07/31/2017 12.1  11.0 - 15.0 % Final  . Platelets 07/31/2017 201  140 - 400 Thousand/uL Final  . MPV 07/31/2017 11.6  7.5 - 12.5 fL Final  . Neutro Abs 07/31/2017 3,556  1,500 - 7,800 cells/uL Final  . Lymphs Abs 07/31/2017 2,022  850 - 3,900 cells/uL Final  . WBC mixed population 07/31/2017 748  200 - 950 cells/uL Final  . Eosinophils Absolute 07/31/2017 124  15 - 500 cells/uL Final  . Basophils Absolute 07/31/2017 52  0 - 200 cells/uL Final  . Neutrophils Relative % 07/31/2017 54.7  % Final  . Total Lymphocyte 07/31/2017 31.1  % Final  . Monocytes Relative 07/31/2017 11.5  % Final  . Eosinophils Relative 07/31/2017 1.9  % Final  . Basophils Relative 07/31/2017 0.8  % Final  . Glucose, Bld 07/31/2017 138* 65 - 99 mg/dL Final   Comment: .            Fasting reference interval . For someone without known diabetes, a glucose value >125 mg/dL indicates that they may have diabetes and this should be confirmed with a follow-up test. .   . BUN 07/31/2017 16  7 - 25 mg/dL Final  . Creat 07/31/2017 0.69* 0.70 - 1.33 mg/dL Final   Comment: For patients >8 years of age, the reference limit for Creatinine is approximately 13% higher for people identified as African-American. .   . GFR,  Est Non African American 07/31/2017 105  > OR = 60 mL/min/1.61m2 Final  . GFR, Est African American 07/31/2017 122  > OR = 60 mL/min/1.45m2 Final  . BUN/Creatinine Ratio 07/31/2017 23* 6 - 22 (calc) Final  . Sodium 07/31/2017 139  135 - 146 mmol/L Final  . Potassium 07/31/2017 5.4* 3.5 - 5.3 mmol/L Final  . Chloride 07/31/2017 104  98 - 110 mmol/L Final  . CO2 07/31/2017 27  20 - 32 mmol/L Final  . Calcium 07/31/2017 9.8  8.6 - 10.3 mg/dL Final  . Total Protein 07/31/2017 6.9  6.1 - 8.1 g/dL Final  . Albumin 07/31/2017 4.2  3.6 - 5.1 g/dL Final  .  Globulin 07/31/2017 2.7  1.9 - 3.7 g/dL (calc) Final  . AG Ratio 07/31/2017 1.6  1.0 - 2.5 (calc) Final  . Total Bilirubin 07/31/2017 0.9  0.2 - 1.2 mg/dL Final  . Alkaline phosphatase (APISO) 07/31/2017 70  40 - 115 U/L Final  . AST 07/31/2017 30  10 - 35 U/L Final  . ALT 07/31/2017 71* 9 - 46 U/L Final  . Hgb A1c MFr Bld 07/31/2017 6.4* <5.7 % of total Hgb Final   Comment: For someone without known diabetes, a hemoglobin  A1c value between 5.7% and 6.4% is consistent with prediabetes and should be confirmed with a  follow-up test. . For someone with known diabetes, a value <7% indicates that their diabetes is well controlled. A1c targets should be individualized based on duration of diabetes, age, comorbid conditions, and other considerations. . This assay result is consistent with an increased risk of diabetes. . Currently, no consensus exists regarding use of hemoglobin A1c for diagnosis of diabetes for children. .   . Mean Plasma Glucose 07/31/2017 137  (calc) Final  . eAG (mmol/L) 07/31/2017 7.6  (calc) Final  . Cholesterol 07/31/2017 188  <200 mg/dL Final  . HDL 07/31/2017 51  >40 mg/dL Final  . Triglycerides 07/31/2017 218* <150 mg/dL Final  . LDL Cholesterol (Calc) 07/31/2017 103* mg/dL (calc) Final   Comment: Reference range: <100 . Desirable range <100 mg/dL for primary prevention;   <70 mg/dL for patients with CHD or  diabetic patients  with > or = 2 CHD risk factors. Marland Kitchen LDL-C is now calculated using the Martin-Hopkins  calculation, which is a validated novel method providing  better accuracy than the Friedewald equation in the  estimation of LDL-C.  Cresenciano Genre et al. Annamaria Helling. 7829;562(13): 2061-2068  (http://education.[facility].com/faq/FAQ164)   . Total CHOL/HDL Ratio 07/31/2017 3.7  <5.0 (calc) Final  . Non-HDL Cholesterol (Calc) 07/31/2017 137* <130 mg/dL (calc) Final   Comment: For patients with diabetes plus 1 major ASCVD risk  factor, treating to a non-HDL-C goal of <100 mg/dL  (LDL-C of <70 mg/dL) is considered a therapeutic  option.    His liver tests remain elevated.  He has had negative hepatitis serologies performed in 2014. he is also had a right upper quadrant ultrasound that confirmed hepatic steatosis in 2015.  He continues to consume alcohol.  His blood pressure today is also extremely high.  He denies any chest pain shortness of breath or dyspnea on exertion.  He is due for a tetanus shot which he agrees to.  He refuses a flu shot.  Hepatitis C screening is up-to-date.  He declines HIV screening.  Colonoscopy is up-to-date.  He states that he has had a diabetic eye exam performed within the last year in [place].  When he comes back in 1 month for recheck of his blood pressure, he will bring me the doctor's name so that we can get those records.  Diabetic foot exam was performed today and is normal  Past Medical History:  Diagnosis Date  . Arthritis    knees  . Diabetes ([place])   . Hyperlipidemia   . Hypertension   . Personal history of colonic polyps-adenomas 10/15/2012   Past Surgical History:  Procedure Laterality Date  . KNEE ARTHROSCOPY     bilateral; twice on each knee  . KNEE ARTHROTOMY  1976   right  . UMBILICAL HERNIA REPAIR     x 2   Current Outpatient Medications on File Prior to Visit  Medication Sig  Dispense Refill  . amLODipine (NORVASC) 10 MG tablet TAKE 1  TABLET BY MOUTH DAILY. NEEDS OFFICE VISIT AND LABS BEFORE FURTHER REFILLS 30 tablet 0  . ampicillin (PRINCIPEN) 500 MG capsule Take 500 mg 4 (four) times daily by mouth. For Gum infection    . aspirin 81 MG tablet Take 81 mg by mouth daily.    . fish oil-omega-3 fatty acids 1000 MG capsule Take by mouth daily. Takes 2 capsules daily    . glipiZIDE (GLUCOTROL) 5 MG tablet TAKE 1 TABLET (5 MG TOTAL) BY MOUTH DAILY. NEEDS OFFICE VISIT AND LABS BEFORE FURTHER REFILLS 30 tablet 0  . Green Tea, Camillia sinensis, (GREEN TEA PO) Take by mouth. Takes 2 green tea capsules daily    . INVOKANA 300 MG TABS tablet TAKE 1 TABLET BY MOUTH EVERY DAY BEFORE BREAKFAST 30 tablet 3  . losartan-hydrochlorothiazide (HYZAAR) 100-12.5 MG tablet TAKE 1 TABLET BY MOUTH DAILY. NEEDS OFFICE VISIT AND LABS BEFORE FURTHER REFILLS 30 tablet 0  . metFORMIN (GLUCOPHAGE) 1000 MG tablet TAKE 1 TABLET BY MOUTH TWICE A DAY WITH FOOD. NEEDS OFFICE VISIT FOR FURTHER REFILLS 60 tablet 0   No current facility-administered medications on file prior to visit.    No Known Allergies Social History   Socioeconomic History  . Marital status: Married    Spouse name: Not on file  . Number of children: Not on file  . Years of education: Not on file  . Highest education level: Not on file  Social Needs  . Financial resource strain: Not on file  . Food insecurity - worry: Not on file  . Food insecurity - inability: Not on file  . Transportation needs - medical: Not on file  . Transportation needs - non-medical: Not on file  Occupational History  . Not on file  Tobacco Use  . Smoking status: Never Smoker  . Smokeless tobacco: Never Used  Substance and Sexual Activity  . Alcohol use: Yes    Alcohol/week: 21.0 oz    Types: 35 Cans of beer per week    Comment: 4-5 times weekly,4 to 5 beers  . Drug use: No  . Sexual activity: Yes    Comment: married  Other Topics Concern  . Not on file  Social History Narrative  . Not on file    Family History  Problem Relation Age of Onset  . Colon cancer Neg Hx   . Stomach cancer Neg Hx   . Cancer Mother        breast  . Cancer Father       Review of Systems  All other systems reviewed and are negative.      Objective:   Physical Exam  Constitutional: He is oriented to person, place, and time. He appears well-developed and well-nourished. No distress.  HENT:  Head: Normocephalic and atraumatic.  Right Ear: External ear normal.  Left Ear: External ear normal.  Nose: Nose normal.  Mouth/Throat: Oropharynx is clear and moist. No oropharyngeal exudate.  Eyes: Conjunctivae and EOM are normal. Pupils are equal, round, and reactive to light. Right eye exhibits no discharge. Left eye exhibits no discharge. No scleral icterus.  Neck: Neck supple. No JVD present. No tracheal deviation present. No thyromegaly present.  Cardiovascular: Normal rate, regular rhythm, normal heart sounds and intact distal pulses. Exam reveals no gallop and no friction rub.  No murmur heard. Pulmonary/Chest: Effort normal and breath sounds normal. No stridor. No respiratory distress. He has no wheezes. He has no  rales. He exhibits no tenderness.  Abdominal: Soft. Bowel sounds are normal. He exhibits no distension and no mass. There is no tenderness. There is no rebound and no guarding.  Musculoskeletal: He exhibits edema.  Lymphadenopathy:    He has no cervical adenopathy.  Neurological: He is alert and oriented to person, place, and time. He has normal reflexes. No cranial nerve deficit. He exhibits normal muscle tone. Coordination normal.  Skin: Skin is warm. No rash noted. He is not diaphoretic. No erythema. No pallor.  Psychiatric: He has a normal mood and affect. His behavior is normal. Judgment and thought content normal.  Vitals reviewed.         Assessment & Plan:  Routine general medical examination at a health care facility  Controlled type 2 diabetes mellitus without  complication, without long-term current use of insulin ([place])  Essential hypertension  Dyslipidemia  Fatty liver  I spent more than 35 minutes with the patient discussing his medical problems.  The majority of his problems stem from his morbid obesity.  I have recommended that we focus and try to lose weight to get below 300 pounds over the next 6-12 months.  I would like to start the patient on doxazosin 4 mg a day for his blood pressure and recheck blood pressure in 1 month.  At that time, he will bring me the name of the eye doctor who performed his eye exam so I can update his medical records.  Hemoglobin A1c is excellent.  LDL cholesterol is acceptable.  We discussed fatty liver disease.  I recommended a vegetarian diet, 50% reduction in calorie intake, and 30 minutes a day of aerobic exercise 5 days a week.  I also recommended cessation of all alcohol.  I believe he does these things, his liver tests will normalize over the next 6 months.  Without doing these things, he could be facing cirrhosis over time.  Next month when he returns for his blood pressure check, I will add a PSA to screen for prostate cancer.  Colon cancer screening is up-to-date.  He refuses a flu shot.  He received a tetanus vaccine.  Diabetic foot exam was performed and is normal

## 2017-08-22 ENCOUNTER — Other Ambulatory Visit: Payer: Self-pay | Admitting: Family Medicine

## 2017-09-04 ENCOUNTER — Ambulatory Visit: Payer: 59 | Admitting: Family Medicine

## 2017-10-02 ENCOUNTER — Other Ambulatory Visit: Payer: Self-pay | Admitting: Family Medicine

## 2017-11-23 ENCOUNTER — Other Ambulatory Visit: Payer: Self-pay | Admitting: Family Medicine

## 2018-02-01 ENCOUNTER — Other Ambulatory Visit: Payer: Self-pay | Admitting: Family Medicine

## 2018-03-04 ENCOUNTER — Other Ambulatory Visit: Payer: Self-pay | Admitting: Family Medicine

## 2018-04-07 ENCOUNTER — Other Ambulatory Visit: Payer: Self-pay | Admitting: Family Medicine

## 2018-04-09 ENCOUNTER — Encounter: Payer: Self-pay | Admitting: Family Medicine

## 2018-05-06 ENCOUNTER — Other Ambulatory Visit: Payer: Self-pay | Admitting: Family Medicine

## 2018-06-05 ENCOUNTER — Other Ambulatory Visit: Payer: Self-pay | Admitting: Family Medicine

## 2018-06-08 ENCOUNTER — Other Ambulatory Visit: Payer: Self-pay | Admitting: Family Medicine

## 2018-06-08 MED ORDER — CANAGLIFLOZIN 300 MG PO TABS: ORAL_TABLET | ORAL | 0 refills | Status: DC

## 2018-06-08 NOTE — Telephone Encounter (Signed)
I denied pt's invokana as he needs an apt and labs. He was supposed to follow up in December of 2018 and as of today has not done so. He called and stated that he needs one more months worth until next month when he can come in for his physical. Med refill for only 30 days. Pt must come in for ov and labs.

## 2018-07-05 ENCOUNTER — Other Ambulatory Visit: Payer: Self-pay | Admitting: Family Medicine

## 2018-07-06 ENCOUNTER — Other Ambulatory Visit: Payer: Self-pay | Admitting: Family Medicine

## 2018-08-02 ENCOUNTER — Other Ambulatory Visit: Payer: Self-pay | Admitting: Family Medicine

## 2018-08-05 ENCOUNTER — Other Ambulatory Visit: Payer: Self-pay | Admitting: Family Medicine

## 2018-08-10 ENCOUNTER — Other Ambulatory Visit: Payer: Self-pay

## 2018-08-10 MED ORDER — LOSARTAN POTASSIUM-HCTZ 100-12.5 MG PO TABS: 1.0000 | ORAL_TABLET | Freq: Every day | ORAL | 0 refills | Status: DC

## 2018-08-11 ENCOUNTER — Telehealth: Payer: Self-pay | Admitting: *Deleted

## 2018-08-11 MED ORDER — HYDROCHLOROTHIAZIDE 12.5 MG PO TABS: 12.5000 mg | ORAL_TABLET | Freq: Every day | ORAL | 3 refills | Status: DC

## 2018-08-11 MED ORDER — LOSARTAN POTASSIUM 100 MG PO TABS: 100.0000 mg | ORAL_TABLET | Freq: Every day | ORAL | 3 refills | Status: DC

## 2018-08-11 NOTE — Telephone Encounter (Signed)
Received fax requesting alternative to Losartan HCTZ. States that medication is on back order with no release date.   Medication separated and sent to pharmacy.

## 2018-08-14 ENCOUNTER — Other Ambulatory Visit: Payer: Self-pay | Admitting: Family Medicine

## 2018-09-08 ENCOUNTER — Other Ambulatory Visit: Payer: Self-pay | Admitting: Family Medicine

## 2018-09-09 ENCOUNTER — Other Ambulatory Visit: Payer: Self-pay | Admitting: Family Medicine

## 2018-09-12 ENCOUNTER — Other Ambulatory Visit: Payer: Self-pay | Admitting: Family Medicine

## 2018-10-01 ENCOUNTER — Other Ambulatory Visit: Payer: 59

## 2018-10-01 ENCOUNTER — Telehealth: Payer: Self-pay | Admitting: Family Medicine

## 2018-10-01 DIAGNOSIS — I10 Essential (primary) hypertension: Secondary | ICD-10-CM

## 2018-10-01 DIAGNOSIS — Z79899 Other long term (current) drug therapy: Secondary | ICD-10-CM

## 2018-10-01 DIAGNOSIS — E785 Hyperlipidemia, unspecified: Secondary | ICD-10-CM

## 2018-10-01 DIAGNOSIS — E119 Type 2 diabetes mellitus without complications: Secondary | ICD-10-CM

## 2018-10-01 DIAGNOSIS — Z125 Encounter for screening for malignant neoplasm of prostate: Secondary | ICD-10-CM

## 2018-10-01 DIAGNOSIS — K760 Fatty (change of) liver, not elsewhere classified: Secondary | ICD-10-CM

## 2018-10-01 DIAGNOSIS — Z0000 Encounter for general adult medical examination without abnormal findings: Secondary | ICD-10-CM

## 2018-10-01 MED ORDER — CANAGLIFLOZIN 300 MG PO TABS: ORAL_TABLET | ORAL | 0 refills | Status: DC

## 2018-10-01 MED ORDER — GLIPIZIDE 5 MG PO TABS: 5.0000 mg | ORAL_TABLET | Freq: Every day | ORAL | 0 refills | Status: DC

## 2018-10-01 MED ORDER — DOXAZOSIN MESYLATE 4 MG PO TABS: 4.0000 mg | ORAL_TABLET | Freq: Every day | ORAL | 0 refills | Status: DC

## 2018-10-01 NOTE — Telephone Encounter (Signed)
Medication called/sent to requested pharmacy and he must keep his apt has been over a year since last seen. Will not refill his meds if he does not show up for apt. Pt is aware of this.

## 2018-10-01 NOTE — Telephone Encounter (Signed)
Patient made appt for cpe Needs refills on his invokana, doxazosin, glipizide  [facility]

## 2018-10-02 LAB — COMPREHENSIVE METABOLIC PANEL
AG RATIO: 1.8 (calc) (ref 1.0–2.5)
ALT: 82 U/L — ABNORMAL HIGH (ref 9–46)
AST: 42 U/L — ABNORMAL HIGH (ref 10–35)
Albumin: 4.3 g/dL (ref 3.6–5.1)
Alkaline phosphatase (APISO): 65 U/L (ref 40–115)
BUN / CREAT RATIO: 23 (calc) — AB (ref 6–22)
BUN: 15 mg/dL (ref 7–25)
CHLORIDE: 102 mmol/L (ref 98–110)
CO2: 29 mmol/L (ref 20–32)
Calcium: 9.6 mg/dL (ref 8.6–10.3)
Creat: 0.66 mg/dL — ABNORMAL LOW (ref 0.70–1.33)
Globulin: 2.4 g/dL (calc) (ref 1.9–3.7)
Glucose, Bld: 190 mg/dL — ABNORMAL HIGH (ref 65–99)
Potassium: 4.9 mmol/L (ref 3.5–5.3)
Sodium: 139 mmol/L (ref 135–146)
Total Bilirubin: 0.9 mg/dL (ref 0.2–1.2)
Total Protein: 6.7 g/dL (ref 6.1–8.1)

## 2018-10-02 LAB — LIPID PANEL
Cholesterol: 200 mg/dL — ABNORMAL HIGH (ref <200)
HDL: 50 mg/dL (ref >40)
LDL Cholesterol (Calc): 114 mg/dL (calc) — ABNORMAL HIGH
Non-HDL Cholesterol (Calc): 150 mg/dL (calc) — ABNORMAL HIGH (ref <130)
TRIGLYCERIDES: 255 mg/dL — AB (ref <150)
Total CHOL/HDL Ratio: 4 (calc) (ref <5.0)

## 2018-10-02 LAB — CBC WITH DIFFERENTIAL/PLATELET
Absolute Monocytes: 794 cells/uL (ref 200–950)
Basophils Absolute: 51 cells/uL (ref 0–200)
Basophils Relative: 0.8 %
Eosinophils Absolute: 70 cells/uL (ref 15–500)
Eosinophils Relative: 1.1 %
HCT: 49.1 % (ref 38.5–50.0)
Hemoglobin: 16.3 g/dL (ref 13.2–17.1)
Lymphs Abs: 1722 cells/uL (ref 850–3900)
MCH: 29.3 pg (ref 27.0–33.0)
MCHC: 33.2 g/dL (ref 32.0–36.0)
MCV: 88.2 fL (ref 80.0–100.0)
MPV: 11.3 fL (ref 7.5–12.5)
Monocytes Relative: 12.4 %
Neutro Abs: 3763 cells/uL (ref 1500–7800)
Neutrophils Relative %: 58.8 %
Platelets: 220 10*3/uL (ref 140–400)
RBC: 5.57 10*6/uL (ref 4.20–5.80)
RDW: 12.1 % (ref 11.0–15.0)
Total Lymphocyte: 26.9 %
WBC: 6.4 10*3/uL (ref 3.8–10.8)

## 2018-10-02 LAB — PSA: PSA: 2.7 ng/mL (ref <=4.0)

## 2018-10-02 LAB — HEMOGLOBIN A1C
Hgb A1c MFr Bld: 7 % of total Hgb — ABNORMAL HIGH (ref <5.7)
MEAN PLASMA GLUCOSE: 154 (calc)
eAG (mmol/L): 8.5 (calc)

## 2018-10-05 ENCOUNTER — Ambulatory Visit (INDEPENDENT_AMBULATORY_CARE_PROVIDER_SITE_OTHER): Payer: 59 | Admitting: Family Medicine

## 2018-10-05 ENCOUNTER — Encounter: Payer: Self-pay | Admitting: Family Medicine

## 2018-10-05 VITALS — BP 155/88 | HR 98 | Temp 98.1°F | Resp 20 | Ht 73.0 in | Wt 350.0 lb

## 2018-10-05 DIAGNOSIS — Z0000 Encounter for general adult medical examination without abnormal findings: Secondary | ICD-10-CM

## 2018-10-05 DIAGNOSIS — E785 Hyperlipidemia, unspecified: Secondary | ICD-10-CM | POA: Diagnosis not present

## 2018-10-05 DIAGNOSIS — Z1211 Encounter for screening for malignant neoplasm of colon: Secondary | ICD-10-CM

## 2018-10-05 DIAGNOSIS — E1165 Type 2 diabetes mellitus with hyperglycemia: Secondary | ICD-10-CM

## 2018-10-05 DIAGNOSIS — IMO0001 Reserved for inherently not codable concepts without codable children: Secondary | ICD-10-CM

## 2018-10-05 DIAGNOSIS — E7800 Pure hypercholesterolemia, unspecified: Secondary | ICD-10-CM

## 2018-10-05 DIAGNOSIS — I10 Essential (primary) hypertension: Secondary | ICD-10-CM | POA: Diagnosis not present

## 2018-10-05 MED ORDER — ATORVASTATIN CALCIUM 20 MG PO TABS: 20.0000 mg | ORAL_TABLET | Freq: Every day | ORAL | 3 refills | Status: DC

## 2018-10-05 MED ORDER — LOSARTAN POTASSIUM 100 MG PO TABS: 100.0000 mg | ORAL_TABLET | Freq: Every day | ORAL | 1 refills | Status: DC

## 2018-10-05 MED ORDER — GLIPIZIDE 5 MG PO TABS: 5.0000 mg | ORAL_TABLET | Freq: Every day | ORAL | 1 refills | Status: DC

## 2018-10-05 MED ORDER — ATORVASTATIN CALCIUM 20 MG PO TABS: 20.0000 mg | ORAL_TABLET | Freq: Every day | ORAL | 1 refills | Status: DC

## 2018-10-05 MED ORDER — DOXAZOSIN MESYLATE 4 MG PO TABS: 4.0000 mg | ORAL_TABLET | Freq: Every day | ORAL | 1 refills | Status: DC

## 2018-10-05 MED ORDER — HYDROCHLOROTHIAZIDE 12.5 MG PO TABS: 12.5000 mg | ORAL_TABLET | Freq: Every day | ORAL | 1 refills | Status: DC

## 2018-10-05 MED ORDER — AMLODIPINE BESYLATE 10 MG PO TABS: 10.0000 mg | ORAL_TABLET | Freq: Every day | ORAL | 1 refills | Status: DC

## 2018-10-05 MED ORDER — NEOMYCIN-POLYMYXIN-HC 3.5-10000-1 OT SOLN: 3.0000 [drp] | Freq: Four times a day (QID) | OTIC | 0 refills | Status: DC

## 2018-10-05 MED ORDER — CANAGLIFLOZIN 300 MG PO TABS: ORAL_TABLET | ORAL | 1 refills | Status: DC

## 2018-10-05 NOTE — Progress Notes (Signed)
Subjective:    Patient ID: Christopher Spence, male    DOB: 1961/03/31, 58 y.o.   MRN: [id number]  HPI   Patient is here today for complete physical exam.  His most recent lab work is listed below: Lab on 10/01/2018  Component Date Value Ref Range Status  . WBC 10/01/2018 6.4  3.8 - 10.8 Thousand/uL Final  . RBC 10/01/2018 5.57  4.20 - 5.80 Million/uL Final  . Hemoglobin 10/01/2018 16.3  13.2 - 17.1 g/dL Final  . HCT 10/01/2018 49.1  38.5 - 50.0 % Final  . MCV 10/01/2018 88.2  80.0 - 100.0 fL Final  . MCH 10/01/2018 29.3  27.0 - 33.0 pg Final  . MCHC 10/01/2018 33.2  32.0 - 36.0 g/dL Final  . RDW 10/01/2018 12.1  11.0 - 15.0 % Final  . Platelets 10/01/2018 220  140 - 400 Thousand/uL Final  . MPV 10/01/2018 11.3  7.5 - 12.5 fL Final  . Neutro Abs 10/01/2018 3,763  1,500 - 7,800 cells/uL Final  . Lymphs Abs 10/01/2018 1,722  850 - 3,900 cells/uL Final  . Absolute Monocytes 10/01/2018 794  200 - 950 cells/uL Final  . Eosinophils Absolute 10/01/2018 70  15 - 500 cells/uL Final  . Basophils Absolute 10/01/2018 51  0 - 200 cells/uL Final  . Neutrophils Relative % 10/01/2018 58.8  % Final  . Total Lymphocyte 10/01/2018 26.9  % Final  . Monocytes Relative 10/01/2018 12.4  % Final  . Eosinophils Relative 10/01/2018 1.1  % Final  . Basophils Relative 10/01/2018 0.8  % Final  . Glucose, Bld 10/01/2018 190* 65 - 99 mg/dL Final   Comment: .            Fasting reference interval . For someone without known diabetes, a glucose value >125 mg/dL indicates that they may have diabetes and this should be confirmed with a follow-up test. .   . BUN 10/01/2018 15  7 - 25 mg/dL Final  . Creat 10/01/2018 0.66* 0.70 - 1.33 mg/dL Final   Comment: For patients >35 years of age, the reference limit for Creatinine is approximately 13% higher for people identified as African-American. .   Havery Moros Ratio 10/01/2018 23* 6 - 22 (calc) Final  . Sodium 10/01/2018 139  135 - 146 mmol/L Final  .  Potassium 10/01/2018 4.9  3.5 - 5.3 mmol/L Final  . Chloride 10/01/2018 102  98 - 110 mmol/L Final  . CO2 10/01/2018 29  20 - 32 mmol/L Final  . Calcium 10/01/2018 9.6  8.6 - 10.3 mg/dL Final  . Total Protein 10/01/2018 6.7  6.1 - 8.1 g/dL Final  . Albumin 10/01/2018 4.3  3.6 - 5.1 g/dL Final  . Globulin 10/01/2018 2.4  1.9 - 3.7 g/dL (calc) Final  . AG Ratio 10/01/2018 1.8  1.0 - 2.5 (calc) Final  . Total Bilirubin 10/01/2018 0.9  0.2 - 1.2 mg/dL Final  . Alkaline phosphatase (APISO) 10/01/2018 65  40 - 115 U/L Final  . AST 10/01/2018 42* 10 - 35 U/L Final  . ALT 10/01/2018 82* 9 - 46 U/L Final  . Hgb A1c MFr Bld 10/01/2018 7.0* <5.7 % of total Hgb Final   Comment: For someone without known diabetes, a hemoglobin A1c value of 6.5% or greater indicates that they may have  diabetes and this should be confirmed with a follow-up  test. . For someone with known diabetes, a value <7% indicates  that their diabetes is well controlled and a value  greater  than or equal to 7% indicates suboptimal  control. A1c targets should be individualized based on  duration of diabetes, age, comorbid conditions, and  other considerations. . Currently, no consensus exists regarding use of hemoglobin A1c for diagnosis of diabetes for children. .   . Mean Plasma Glucose 10/01/2018 154  (calc) Final  . eAG (mmol/L) 10/01/2018 8.5  (calc) Final  . Cholesterol 10/01/2018 200* <200 mg/dL Final  . HDL 10/01/2018 50  >40 mg/dL Final  . Triglycerides 10/01/2018 255* <150 mg/dL Final   Comment: . If a non-fasting specimen was collected, consider repeat triglyceride testing on a fasting specimen if clinically indicated.  Yates Decamp et al. J. of Clin. Lipidol. 2505;3:976-734. .   . LDL Cholesterol (Calc) 10/01/2018 114* mg/dL (calc) Final   Comment: Reference range: <100 . Desirable range <100 mg/dL for primary prevention;   <70 mg/dL for patients with CHD or diabetic patients  with > or = 2 CHD risk  factors. Marland Kitchen LDL-C is now calculated using the Martin-Hopkins  calculation, which is a validated novel method providing  better accuracy than the Friedewald equation in the  estimation of LDL-C.  Cresenciano Genre et al. Annamaria Helling. 1937;902(40): 2061-2068  (http://education.[facility].com/faq/FAQ164)   . Total CHOL/HDL Ratio 10/01/2018 4.0  <5.0 (calc) Final  . Non-HDL Cholesterol (Calc) 10/01/2018 150* <130 mg/dL (calc) Final   Comment: For patients with diabetes plus 1 major ASCVD risk  factor, treating to a non-HDL-C goal of <100 mg/dL  (LDL-C of <70 mg/dL) is considered a therapeutic  option.   Marland Kitchen PSA 10/01/2018 2.7  < OR = 4.0 ng/mL Final   Comment: The total PSA value from this assay system is  standardized against the WHO standard. The test  result will be approximately 20% lower when compared  to the equimolar-standardized total PSA (Beckman  Coulter). Comparison of serial PSA results should be  interpreted with this fact in mind. . This test was performed using the Siemens  chemiluminescent method. Values obtained from  different assay methods cannot be used interchangeably. PSA levels, regardless of value, should not be interpreted as absolute evidence of the presence or absence of disease.    Patient's last colonoscopy was in 2015.  They recommended a repeat in 2017.  He is overdue.  He is due for a flu shot which he refuses.  He is due for diabetic eye exam which he refuses.  His blood pressure is elevated today at 155/88.  He has not lost any weight.  He walks occasionally but is not making any consistent effort to exercise.  He has been out of numerous of his medications over the last 6 weeks.  We refused to refill some of his medications in order for him to make an office visit as he has not been seen in more than 13 months.  Had a long discussion today with the patient about his medical problems including morbid obesity, uncontrolled diabetes, hyperlipidemia, fatty liver  disease.  Strongly encourage compliance.  I do not know what to make of his lab work given the fact he is not on his medication.  He states that he is checking his sugars at home and they typically are 90-130.  His blood pressure today is elevated however he has not been on several of his medications.  He is not certain what he has or has not taken.  I do know for a fact that he is not on a statin which is not appropriate for a diabetic. Past Medical History:  Diagnosis Date  . Arthritis    knees  . Diabetes ([place])   . Hyperlipidemia   . Hypertension   . Personal history of colonic polyps-adenomas 10/15/2012   Past Surgical History:  Procedure Laterality Date  . COLONOSCOPY  10/15/2012   Procedure: COLONOSCOPY;  Surgeon: Gatha Mayer, MD;  Location: [facility];  Service: Endoscopy;  Laterality: N/A;  . KNEE ARTHROSCOPY     bilateral; twice on each knee  . KNEE ARTHROTOMY  1976   right  . UMBILICAL HERNIA REPAIR     x 2   Current Outpatient Medications on File Prior to Visit  Medication Sig Dispense Refill  . amLODipine (NORVASC) 10 MG tablet TAKE 1 TABLET BY MOUTH EVERY DAY 30 tablet 11  . aspirin 81 MG tablet Take 81 mg by mouth daily.    . canagliflozin (INVOKANA) 300 MG TABS tablet TAKE 1 TABLET BY MOUTH EVERY DAY BEFORE BREAKFAST 30 tablet 0  . doxazosin (CARDURA) 4 MG tablet Take 1 tablet (4 mg total) by mouth daily. Needs office visit and labs before further refills 30 tablet 0  . fish oil-omega-3 fatty acids 1000 MG capsule Take by mouth daily. Takes 2 capsules daily    . glipiZIDE (GLUCOTROL) 5 MG tablet TAKE 1 TABLET (5 MG TOTAL) BY MOUTH DAILY BEFORE BREAKFAST. 30 tablet 3  . Green Tea, Camillia sinensis, (GREEN TEA PO) Take by mouth. Takes 2 green tea capsules daily    . hydrochlorothiazide (HYDRODIURIL) 12.5 MG tablet Take 1 tablet (12.5 mg total) by mouth daily. 90 tablet 3  . losartan (COZAAR) 100 MG tablet Take 1 tablet (100 mg total) by mouth daily. 90 tablet 3  .  metFORMIN (GLUCOPHAGE) 1000 MG tablet TAKE 1 TABLET (1,000 MG TOTAL) BY MOUTH 2 TIMES DAILY WITH A MEAL. 180 tablet 1   No current facility-administered medications on file prior to visit.    No Known Allergies Social History   Socioeconomic History  . Marital status: Married    Spouse name: Not on file  . Number of children: Not on file  . Years of education: Not on file  . Highest education level: Not on file  Occupational History  . Not on file  Social Needs  . Financial resource strain: Not on file  . Food insecurity:    Worry: Not on file    Inability: Not on file  . Transportation needs:    Medical: Not on file    Non-medical: Not on file  Tobacco Use  . Smoking status: Never Smoker  . Smokeless tobacco: Never Used  Substance and Sexual Activity  . Alcohol use: Yes    Alcohol/week: 35.0 standard drinks    Types: 35 Cans of beer per week    Comment: 4-5 times weekly,4 to 5 beers  . Drug use: No  . Sexual activity: Yes    Comment: married  Lifestyle  . Physical activity:    Days per week: Not on file    Minutes per session: Not on file  . Stress: Not on file  Relationships  . Social connections:    Talks on phone: Not on file    Gets together: Not on file    Attends religious service: Not on file    Active member of club or organization: Not on file    Attends meetings of clubs or organizations: Not on file    Relationship status: Not on file  . Intimate partner violence:    Fear of current  or ex partner: Not on file    Emotionally abused: Not on file    Physically abused: Not on file    Forced sexual activity: Not on file  Other Topics Concern  . Not on file  Social History Narrative  . Not on file   Family History  Problem Relation Age of Onset  . Colon cancer Neg Hx   . Stomach cancer Neg Hx   . Cancer Mother        breast  . Cancer Father       Review of Systems  All other systems reviewed and are negative.      Objective:   Physical  Exam  Constitutional: He is oriented to person, place, and time. He appears well-developed and well-nourished. No distress.  HENT:  Head: Normocephalic and atraumatic.  Right Ear: External ear normal.  Left Ear: External ear normal.  Nose: Nose normal.  Mouth/Throat: Oropharynx is clear and moist. No oropharyngeal exudate.  Eyes: Pupils are equal, round, and reactive to light. Conjunctivae and EOM are normal. Right eye exhibits no discharge. Left eye exhibits no discharge. No scleral icterus.  Neck: Neck supple. No JVD present. No tracheal deviation present. No thyromegaly present.  Cardiovascular: Normal rate, regular rhythm, normal heart sounds and intact distal pulses. Exam reveals no gallop and no friction rub.  No murmur heard. Pulmonary/Chest: Effort normal and breath sounds normal. No stridor. No respiratory distress. He has no wheezes. He has no rales. He exhibits no tenderness.  Abdominal: Soft. Bowel sounds are normal. He exhibits no distension and no mass. There is no abdominal tenderness. There is no rebound and no guarding.  Musculoskeletal:        General: Edema present.  Lymphadenopathy:    He has no cervical adenopathy.  Neurological: He is alert and oriented to person, place, and time. He has normal reflexes. No cranial nerve deficit. He exhibits normal muscle tone. Coordination normal.  Skin: Skin is warm. No rash noted. He is not diaphoretic. No erythema. No pallor.  Psychiatric: He has a normal mood and affect. His behavior is normal. Judgment and thought content normal.  Vitals reviewed.         Assessment & Plan:  Routine general medical examination at a health care facility - Plan: Ambulatory referral to Gastroenterology, Ambulatory referral to Ophthalmology  Essential hypertension  Dyslipidemia  Pure hypercholesterolemia  Colon cancer screening - Plan: Ambulatory referral to Gastroenterology  Uncontrolled diabetes mellitus type 2 without complications  ([place]) - Plan: Ambulatory referral to Ophthalmology   I spent more than 35 minutes with the patient discussing his medical problems.  The majority of his problems stem from his morbid obesity.  I have recommended that we focus and try to lose weight to get below 300 pounds over the next 6-12 months.   Recommended a flu shot which he refused.  We will schedule the patient for a colonoscopy.  We will schedule the patient for diabetic eye exam.  Start Lipitor 20 mg a day given the fact he is a diabetic.  Otherwise I want him to resume all the medications on his medication list and return in 3 months fasting to repeat lab work so that we can determine how well we are controlling his diabetes and his cholesterol.  Strongly encourage compliance.  PSA was within normal limits

## 2018-10-16 LAB — HM DIABETES EYE EXAM

## 2018-10-18 ENCOUNTER — Encounter: Payer: Self-pay | Admitting: *Deleted

## 2018-10-30 ENCOUNTER — Encounter: Payer: Self-pay | Admitting: Internal Medicine

## 2018-12-03 ENCOUNTER — Other Ambulatory Visit: Payer: Self-pay

## 2018-12-03 ENCOUNTER — Encounter: Payer: Self-pay | Admitting: Internal Medicine

## 2018-12-03 ENCOUNTER — Ambulatory Visit (AMBULATORY_SURGERY_CENTER): Payer: Self-pay | Admitting: *Deleted

## 2018-12-03 ENCOUNTER — Telehealth: Payer: Self-pay | Admitting: *Deleted

## 2018-12-03 VITALS — Ht 73.0 in | Wt 358.0 lb

## 2018-12-03 DIAGNOSIS — Z86010 Personal history of colonic polyps: Secondary | ICD-10-CM

## 2018-12-03 NOTE — Progress Notes (Signed)
No egg or soy allergy known to patient  No issues with past sedation with any surgeries  or procedures, no intubation problems - pt states was told he has a small airway but never any issues with intubation- Last colon [facility] due to weight  No diet pills per patient No home 02 use per patient  No blood thinners per patient  Pt denies issues with constipation  No A fib or A flutter  EMMI video sent to pt's e mail - declined

## 2018-12-03 NOTE — Telephone Encounter (Signed)
John,  Can you please review this chart.  pt states was told he has a small airway but never any issues with intubation- Last colon [facility] due to weight . When he opens his mouth, I can see the uvula with no issues, he does have a small mouth. Weight is 358.0lb, BMI is 47.23   Thanks,Marie

## 2018-12-03 NOTE — Telephone Encounter (Signed)
Marie,  This pt is cleared for anesthetic care at [facility].  Thanks,  Selig [place]ole 

## 2018-12-17 ENCOUNTER — Encounter: Payer: Self-pay | Admitting: Internal Medicine

## 2019-01-07 ENCOUNTER — Other Ambulatory Visit: Payer: 59

## 2019-01-07 ENCOUNTER — Other Ambulatory Visit: Payer: Self-pay

## 2019-01-07 DIAGNOSIS — I10 Essential (primary) hypertension: Secondary | ICD-10-CM

## 2019-01-07 DIAGNOSIS — E785 Hyperlipidemia, unspecified: Secondary | ICD-10-CM

## 2019-01-07 DIAGNOSIS — E119 Type 2 diabetes mellitus without complications: Secondary | ICD-10-CM

## 2019-01-08 LAB — COMPLETE METABOLIC PANEL WITH GFR
AG Ratio: 1.8 (calc) (ref 1.0–2.5)
ALT: 43 U/L (ref 9–46)
AST: 24 U/L (ref 10–35)
Albumin: 4.2 g/dL (ref 3.6–5.1)
Alkaline phosphatase (APISO): 60 U/L (ref 35–144)
BUN: 13 mg/dL (ref 7–25)
CO2: 29 mmol/L (ref 20–32)
Calcium: 9.6 mg/dL (ref 8.6–10.3)
Chloride: 105 mmol/L (ref 98–110)
Creat: 0.71 mg/dL (ref 0.70–1.33)
GFR, Est African American: 119 mL/min/{1.73_m2} (ref >=60)
GFR, Est Non African American: 103 mL/min/{1.73_m2} (ref >=60)
Globulin: 2.4 g/dL (calc) (ref 1.9–3.7)
Glucose, Bld: 136 mg/dL — ABNORMAL HIGH (ref 65–99)
Potassium: 5.2 mmol/L (ref 3.5–5.3)
Sodium: 140 mmol/L (ref 135–146)
Total Bilirubin: 0.9 mg/dL (ref 0.2–1.2)
Total Protein: 6.6 g/dL (ref 6.1–8.1)

## 2019-01-08 LAB — CBC WITH DIFFERENTIAL/PLATELET
Absolute Monocytes: 647 cells/uL (ref 200–950)
Basophils Absolute: 49 cells/uL (ref 0–200)
Basophils Relative: 0.8 %
Eosinophils Absolute: 98 cells/uL (ref 15–500)
Eosinophils Relative: 1.6 %
HCT: 46.5 % (ref 38.5–50.0)
Hemoglobin: 15.5 g/dL (ref 13.2–17.1)
Lymphs Abs: 1671 cells/uL (ref 850–3900)
MCH: 28.9 pg (ref 27.0–33.0)
MCHC: 33.3 g/dL (ref 32.0–36.0)
MCV: 86.6 fL (ref 80.0–100.0)
MPV: 11.9 fL (ref 7.5–12.5)
Monocytes Relative: 10.6 %
Neutro Abs: 3636 cells/uL (ref 1500–7800)
Neutrophils Relative %: 59.6 %
Platelets: 213 10*3/uL (ref 140–400)
RBC: 5.37 10*6/uL (ref 4.20–5.80)
RDW: 12 % (ref 11.0–15.0)
Total Lymphocyte: 27.4 %
WBC: 6.1 10*3/uL (ref 3.8–10.8)

## 2019-01-08 LAB — LIPID PANEL
Cholesterol: 128 mg/dL (ref <200)
HDL: 44 mg/dL (ref >=40)
LDL Cholesterol (Calc): 61 mg/dL (calc)
Non-HDL Cholesterol (Calc): 84 mg/dL (calc) (ref <130)
Total CHOL/HDL Ratio: 2.9 (calc) (ref <5.0)
Triglycerides: 152 mg/dL — ABNORMAL HIGH (ref <150)

## 2019-01-08 LAB — HEMOGLOBIN A1C
Hgb A1c MFr Bld: 6.5 % of total Hgb — ABNORMAL HIGH (ref <5.7)
Mean Plasma Glucose: 140 (calc)
eAG (mmol/L): 7.7 (calc)

## 2019-01-08 LAB — MICROALBUMIN / CREATININE URINE RATIO
Creatinine, Urine: 55 mg/dL (ref 20–320)
Microalb Creat Ratio: 4 mcg/mg creat (ref <30)
Microalb, Ur: 0.2 mg/dL

## 2019-01-28 ENCOUNTER — Telehealth: Payer: Self-pay | Admitting: *Deleted

## 2019-01-28 NOTE — Telephone Encounter (Signed)
Call to reschedule procedure,pt. Requesting procedure for first case on a Monday morning,unable to offer first case on Monday,he declined 8:30 am case for a Wednesday,requesting for staff to call back when June schedule is out.

## 2019-02-04 ENCOUNTER — Telehealth: Payer: Self-pay | Admitting: *Deleted

## 2019-02-04 NOTE — Telephone Encounter (Signed)
New instructions mailed to pt. 

## 2019-02-04 NOTE — Telephone Encounter (Signed)
Spoke with patient.  Rescheduled postponed procedure for 6/8 @ 730.  Will mail new instructions via mail.

## 2019-02-16 ENCOUNTER — Other Ambulatory Visit: Payer: Self-pay | Admitting: Family Medicine

## 2019-03-01 ENCOUNTER — Telehealth: Payer: Self-pay | Admitting: *Deleted

## 2019-03-01 NOTE — Telephone Encounter (Signed)
Covid-19 screening questions  Have you traveled in the last 14 days? No. If yes where?  Do you now or have you had a fever in the last 14 days? No.  Do you have any respiratory symptoms of shortness of breath or cough now or in the last 14 days? No.  Do you have any family members or close contacts with diagnosed or suspected Covid-19 in the past 14 days? No.  Have you been tested for Covid-19 and found to be positive? No.       

## 2019-03-04 ENCOUNTER — Ambulatory Visit (AMBULATORY_SURGERY_CENTER): Payer: 59 | Admitting: Internal Medicine

## 2019-03-04 ENCOUNTER — Other Ambulatory Visit: Payer: Self-pay

## 2019-03-04 ENCOUNTER — Encounter: Payer: Self-pay | Admitting: Internal Medicine

## 2019-03-04 VITALS — BP 126/76 | HR 74 | Temp 97.7°F | Resp 12 | Ht 73.0 in | Wt 350.0 lb

## 2019-03-04 DIAGNOSIS — Z86010 Personal history of colonic polyps: Secondary | ICD-10-CM

## 2019-03-04 MED ORDER — SODIUM CHLORIDE 0.9 % IV SOLN: 500.0000 mL | Freq: Once | INTRAVENOUS | Status: DC

## 2019-03-04 NOTE — Progress Notes (Signed)
NO PROBLEMS NOTED IN THE RECOVERY ROOM. MAW

## 2019-03-04 NOTE — Op Note (Signed)
[place] Patient Name: Christopher Spence Procedure Date: 03/04/2019 7:38 AM MRN: [id number] Endoscopist: Gatha Mayer , MD Age: 58 Referring MD:  Date of Birth: Jun 30, 1961 Gender: Male Account #: [id number] Procedure:                Colonoscopy Indications:              Surveillance: Personal history of adenomatous                            polyps on last colonoscopy 5 years ago Medicines:                Propofol per Anesthesia, Monitored Anesthesia Care Procedure:                Pre-Anesthesia Assessment:                           - Prior to the procedure, a History and Physical                            was performed, and patient medications and                            allergies were reviewed. The patient's tolerance of                            previous anesthesia was also reviewed. The risks                            and benefits of the procedure and the sedation                            options and risks were discussed with the patient.                            All questions were answered, and informed consent                            was obtained. Prior Anticoagulants: The patient has                            taken no previous anticoagulant or antiplatelet                            agents. ASA Grade Assessment: III - A patient with                            severe systemic disease. After reviewing the risks                            and benefits, the patient was deemed in                            satisfactory condition to undergo the procedure.  After obtaining informed consent, the colonoscope                            was passed under direct vision. Throughout the                            procedure, the patient's blood pressure, pulse, and                            oxygen saturations were monitored continuously. The                            Colonoscope was introduced through the anus and   advanced to the the cecum, identified by                            appendiceal orifice and ileocecal valve. The                            colonoscopy was performed without difficulty. The                            patient tolerated the procedure well. The quality                            of the bowel preparation was good. The ileocecal                            valve, appendiceal orifice, and rectum were                            photographed. The bowel preparation used was                            Miralax via split dose instruction. Scope In: 7:45:18 AM Scope Out: 7:55:49 AM Scope Withdrawal Time: 0 hours 8 minutes 50 seconds  Total Procedure Duration: 0 hours 10 minutes 31 seconds  Findings:                 The perianal and digital rectal examinations were                            normal. Pertinent negatives include normal prostate                            (size, shape, and consistency).                           The entire examined colon appeared normal on direct                            and retroflexion views. Complications:            No immediate complications. Estimated Blood Loss:     Estimated blood loss: none. Impression:               -  The entire examined colon is normal on direct and                            retroflexion views.                           - No specimens collected.                           - Personal history of colonic polyps. 3 adenomas                            max 12 mm 2014 Recommendation:           - Patient has a contact number available for                            emergencies. The signs and symptoms of potential                            delayed complications were discussed with the                            patient. Return to normal activities tomorrow.                            Written discharge instructions were provided to the                            patient.                           - Resume previous diet.                            - Continue present medications.                           - Repeat colonoscopy in 5 years for surveillance. Gatha Mayer, MD 03/04/2019 8:04:30 AM This report has been signed electronically.

## 2019-03-04 NOTE — Progress Notes (Signed)
Report given to PACU, vss 

## 2019-03-04 NOTE — Progress Notes (Signed)
Pt's states no medical or surgical changes since previsit or office visit. 

## 2019-03-04 NOTE — Patient Instructions (Addendum)
No polyps today!  Your next routine colonoscopy should be in 5 years - 2025.  I appreciate the opportunity to care for you. Gatha Mayer, MD, FACG     YOU HAD AN ENDOSCOPIC PROCEDURE TODAY AT [facility]:   Refer to the procedure report that was given to you for any specific questions about what was found during the examination.  If the procedure report does not answer your questions, please call your gastroenterologist to clarify.  If you requested that your care partner not be given the details of your procedure findings, then the procedure report has been included in a sealed envelope for you to review at your convenience later.  YOU SHOULD EXPECT: Some feelings of bloating in the abdomen. Passage of more gas than usual.  Walking can help get rid of the air that was put into your GI tract during the procedure and reduce the bloating. If you had a lower endoscopy (such as a colonoscopy or flexible sigmoidoscopy) you may notice spotting of blood in your stool or on the toilet paper. If you underwent a bowel prep for your procedure, you may not have a normal bowel movement for a few days.  Please Note:  You might notice some irritation and congestion in your nose or some drainage.  This is from the oxygen used during your procedure.  There is no need for concern and it should clear up in a day or so.  SYMPTOMS TO REPORT IMMEDIATELY:   Following lower endoscopy (colonoscopy or flexible sigmoidoscopy):  Excessive amounts of blood in the stool  Significant tenderness or worsening of abdominal pains  Swelling of the abdomen that is new, acute  Fever of 100F or higher    For urgent or emergent issues, a gastroenterologist can be reached at any hour by calling [phone].   DIET:  We do recommend a small meal at first, but then you may proceed to your regular diet.  Drink plenty of fluids but you should avoid alcoholic beverages for 24 hours.  ACTIVITY:   You should plan to take it easy for the rest of today and you should NOT DRIVE or use heavy machinery until tomorrow (because of the sedation medicines used during the test).    FOLLOW UP: Our staff will call the number listed on your records 48-72 hours following your procedure to check on you and address any questions or concerns that you may have regarding the information given to you following your procedure. If we do not reach you, we will leave a message.  We will attempt to reach you two times.  During this call, we will ask if you have developed any symptoms of COVID 19. If you develop any symptoms (ie: fever, flu-like symptoms, shortness of breath, cough etc.) before then, please call [phone].  If you test positive for Covid 19 in the 2 weeks post procedure, please call and report this information to [place].    If any biopsies were taken you will be contacted by phone or by letter within the next 1-3 weeks.  Please call us at [phone] if you have not heard about the biopsies in 3 weeks.    SIGNATURES/CONFIDENTIALITY: You and/or your care partner have signed paperwork which will be entered into your electronic medical record.  These signatures attest to the fact that that the information above on your After Visit Summary has been reviewed and is understood.  Full responsibility of the confidentiality of this  discharge information lies with you and/or your care-partner.     You may resume your current medications today. Your blood sugar was 133 in the recovery room. Repeat colonoscopy in 5 yrs for surveillance. Please call if any questions or concerns.

## 2019-03-06 ENCOUNTER — Telehealth: Payer: Self-pay | Admitting: *Deleted

## 2019-03-06 ENCOUNTER — Telehealth: Payer: Self-pay

## 2019-03-06 NOTE — Telephone Encounter (Signed)
  Follow up Call-  Call back number 03/04/2019  Post procedure Call Back phone  # [phone]  Permission to leave phone message Yes  Some recent data might be hidden     No answer Will try again mid-day

## 2019-03-06 NOTE — Telephone Encounter (Signed)
First follow up call attempt.  No answer or voicemail option. °

## 2019-03-16 ENCOUNTER — Other Ambulatory Visit: Payer: Self-pay | Admitting: Family Medicine

## 2019-03-27 ENCOUNTER — Other Ambulatory Visit: Payer: Self-pay | Admitting: Family Medicine

## 2019-04-22 ENCOUNTER — Telehealth: Payer: Self-pay | Admitting: Family Medicine

## 2019-04-22 ENCOUNTER — Other Ambulatory Visit: Payer: Self-pay

## 2019-04-22 DIAGNOSIS — R509 Fever, unspecified: Secondary | ICD-10-CM

## 2019-04-22 DIAGNOSIS — R059 Cough, unspecified: Secondary | ICD-10-CM

## 2019-04-22 DIAGNOSIS — R05 Cough: Secondary | ICD-10-CM

## 2019-04-22 DIAGNOSIS — Z20822 Contact with and (suspected) exposure to covid-19: Secondary | ICD-10-CM

## 2019-04-22 MED ORDER — BENZONATATE 200 MG PO CAPS: 200.0000 mg | ORAL_CAPSULE | Freq: Three times a day (TID) | ORAL | 0 refills | Status: DC

## 2019-04-22 NOTE — Telephone Encounter (Signed)
If he is sob, he needs to go to the hospital.  Meanwhile, tessalon perles 200 mg poq 8 hrs prn cough.

## 2019-04-22 NOTE — Telephone Encounter (Signed)
Pt sent to [facility] for COVID testing - order in.

## 2019-04-22 NOTE — Telephone Encounter (Signed)
Pt aware of recommendations and med sent to pharm

## 2019-04-22 NOTE — Telephone Encounter (Signed)
Pt started getting sick last weekend. Was just diarrhea/low grade fever.   The diarrhea lasted for 1 day before it went away but he is still running a fever 99.9-101.00. Now has developed dry cough, fatigue, no appetite, and chills.   Would like an order placed for covid testing if possible.

## 2019-04-22 NOTE — Telephone Encounter (Signed)
Pt would like something sent to his pharmacy for his cough. He has coughed so much his ribs hurt.

## 2019-04-24 LAB — NOVEL CORONAVIRUS, NAA: SARS-CoV-2, NAA: DETECTED — AB

## 2019-04-27 ENCOUNTER — Other Ambulatory Visit: Payer: Self-pay | Admitting: Family Medicine

## 2019-04-30 ENCOUNTER — Other Ambulatory Visit: Payer: Self-pay | Admitting: Family Medicine

## 2019-05-05 ENCOUNTER — Other Ambulatory Visit: Payer: Self-pay | Admitting: Family Medicine

## 2019-07-31 ENCOUNTER — Telehealth: Payer: Self-pay | Admitting: Family Medicine

## 2019-07-31 NOTE — Telephone Encounter (Signed)
PA Submitted through [email or web address] and received the following:  [employer] is reviewing your PA request. Typically an electronic response will be received within 72 hours. To check for an update later, open this request from your dashboard.

## 2019-08-05 NOTE — Telephone Encounter (Signed)
Request Reference Number: [id number]. INVOKANA TAB 300MG  is DENIED for not meeting the prior authorization requirement(s). For further questions, call [phone]. Appeals are not supported through [place]. Please refer to the fax case notice for appeals information and instructions.   Can we switch him to something else?

## 2019-08-05 NOTE — Telephone Encounter (Signed)
Already on glipizide and metformin.  Could try switching invokana to actos 30 mg a day.

## 2019-08-07 MED ORDER — PIOGLITAZONE HCL 30 MG PO TABS: 30.0000 mg | ORAL_TABLET | Freq: Every day | ORAL | 1 refills | Status: DC

## 2019-08-07 NOTE — Telephone Encounter (Signed)
Pt aware and med sent to pharm 

## 2019-09-26 ENCOUNTER — Other Ambulatory Visit: Payer: Self-pay | Admitting: Family Medicine

## 2019-10-18 ENCOUNTER — Telehealth: Payer: Self-pay | Admitting: Family Medicine

## 2019-10-18 MED ORDER — PIOGLITAZONE HCL 30 MG PO TABS: 30.0000 mg | ORAL_TABLET | Freq: Every day | ORAL | 0 refills | Status: DC

## 2019-10-18 MED ORDER — LOSARTAN POTASSIUM 100 MG PO TABS: 100.0000 mg | ORAL_TABLET | Freq: Every day | ORAL | 0 refills | Status: DC

## 2019-10-18 MED ORDER — HYDROCHLOROTHIAZIDE 12.5 MG PO TABS: 12.5000 mg | ORAL_TABLET | Freq: Every day | ORAL | 0 refills | Status: DC

## 2019-10-18 MED ORDER — ATORVASTATIN CALCIUM 20 MG PO TABS: 20.0000 mg | ORAL_TABLET | Freq: Every day | ORAL | 0 refills | Status: DC

## 2019-10-18 MED ORDER — CANAGLIFLOZIN 300 MG PO TABS: ORAL_TABLET | ORAL | 0 refills | Status: DC

## 2019-10-18 MED ORDER — GLIPIZIDE 5 MG PO TABS: 5.0000 mg | ORAL_TABLET | Freq: Every day | ORAL | 0 refills | Status: DC

## 2019-10-18 MED ORDER — DOXAZOSIN MESYLATE 4 MG PO TABS: 4.0000 mg | ORAL_TABLET | Freq: Every day | ORAL | 0 refills | Status: DC

## 2019-10-18 MED ORDER — METFORMIN HCL 1000 MG PO TABS: 1000.0000 mg | ORAL_TABLET | Freq: Two times a day (BID) | ORAL | 0 refills | Status: DC

## 2019-10-18 MED ORDER — AMLODIPINE BESYLATE 10 MG PO TABS: 10.0000 mg | ORAL_TABLET | Freq: Every day | ORAL | 0 refills | Status: DC

## 2019-10-18 NOTE — Telephone Encounter (Signed)
All meds sent for 30 days - pt is over due for apt and labs. Letter mailed to pt to schedule apt.

## 2019-10-18 NOTE — Telephone Encounter (Signed)
Patient calling to say that he needs all of his medications sent to a new pharmacy  [facility]  Would like all of them called in so he can pick them up all at one time  [phone]

## 2019-10-21 ENCOUNTER — Other Ambulatory Visit: Payer: Self-pay

## 2019-10-21 ENCOUNTER — Other Ambulatory Visit: Payer: 59

## 2019-10-21 DIAGNOSIS — E785 Hyperlipidemia, unspecified: Secondary | ICD-10-CM

## 2019-10-21 DIAGNOSIS — E7800 Pure hypercholesterolemia, unspecified: Secondary | ICD-10-CM

## 2019-10-21 DIAGNOSIS — E119 Type 2 diabetes mellitus without complications: Secondary | ICD-10-CM

## 2019-10-21 DIAGNOSIS — I10 Essential (primary) hypertension: Secondary | ICD-10-CM

## 2019-10-21 DIAGNOSIS — Z79899 Other long term (current) drug therapy: Secondary | ICD-10-CM

## 2019-10-22 ENCOUNTER — Other Ambulatory Visit: Payer: Self-pay | Admitting: Family Medicine

## 2019-10-22 LAB — CBC WITH DIFFERENTIAL/PLATELET
Absolute Monocytes: 603 cells/uL (ref 200–950)
Basophils Absolute: 31 cells/uL (ref 0–200)
Basophils Relative: 0.6 %
Eosinophils Absolute: 140 cells/uL (ref 15–500)
Eosinophils Relative: 2.7 %
HCT: 44.2 % (ref 38.5–50.0)
Hemoglobin: 14.7 g/dL (ref 13.2–17.1)
Lymphs Abs: 1602 cells/uL (ref 850–3900)
MCH: 29.9 pg (ref 27.0–33.0)
MCHC: 33.3 g/dL (ref 32.0–36.0)
MCV: 90 fL (ref 80.0–100.0)
MPV: 11.7 fL (ref 7.5–12.5)
Monocytes Relative: 11.6 %
Neutro Abs: 2824 cells/uL (ref 1500–7800)
Neutrophils Relative %: 54.3 %
Platelets: 194 10*3/uL (ref 140–400)
RBC: 4.91 10*6/uL (ref 4.20–5.80)
RDW: 12.7 % (ref 11.0–15.0)
Total Lymphocyte: 30.8 %
WBC: 5.2 10*3/uL (ref 3.8–10.8)

## 2019-10-22 LAB — COMPREHENSIVE METABOLIC PANEL
AG Ratio: 1.8 (calc) (ref 1.0–2.5)
ALT: 28 U/L (ref 9–46)
AST: 16 U/L (ref 10–35)
Albumin: 4.1 g/dL (ref 3.6–5.1)
Alkaline phosphatase (APISO): 59 U/L (ref 35–144)
BUN/Creatinine Ratio: 23 (calc) — ABNORMAL HIGH (ref 6–22)
BUN: 16 mg/dL (ref 7–25)
CO2: 29 mmol/L (ref 20–32)
Calcium: 9.4 mg/dL (ref 8.6–10.3)
Chloride: 106 mmol/L (ref 98–110)
Creat: 0.69 mg/dL — ABNORMAL LOW (ref 0.70–1.25)
Globulin: 2.3 g/dL (calc) (ref 1.9–3.7)
Glucose, Bld: 146 mg/dL — ABNORMAL HIGH (ref 65–99)
Potassium: 4.9 mmol/L (ref 3.5–5.3)
Sodium: 142 mmol/L (ref 135–146)
Total Bilirubin: 0.8 mg/dL (ref 0.2–1.2)
Total Protein: 6.4 g/dL (ref 6.1–8.1)

## 2019-10-22 LAB — HEMOGLOBIN A1C
Hgb A1c MFr Bld: 6.4 % of total Hgb — ABNORMAL HIGH (ref <5.7)
Mean Plasma Glucose: 137 (calc)
eAG (mmol/L): 7.6 (calc)

## 2019-10-22 LAB — LIPID PANEL
Cholesterol: 137 mg/dL (ref <200)
HDL: 62 mg/dL (ref >=40)
LDL Cholesterol (Calc): 49 mg/dL (calc)
Non-HDL Cholesterol (Calc): 75 mg/dL (calc) (ref <130)
Total CHOL/HDL Ratio: 2.2 (calc) (ref <5.0)
Triglycerides: 188 mg/dL — ABNORMAL HIGH (ref <150)

## 2019-10-22 LAB — PSA: PSA: 2.7 ng/mL (ref <=4.0)

## 2019-10-24 ENCOUNTER — Encounter: Payer: 59 | Admitting: Family Medicine

## 2019-10-25 ENCOUNTER — Ambulatory Visit (INDEPENDENT_AMBULATORY_CARE_PROVIDER_SITE_OTHER): Payer: 59 | Admitting: Family Medicine

## 2019-10-25 ENCOUNTER — Encounter: Payer: Self-pay | Admitting: Family Medicine

## 2019-10-25 ENCOUNTER — Other Ambulatory Visit: Payer: Self-pay

## 2019-10-25 VITALS — BP 152/86 | HR 102 | Temp 96.8°F | Resp 18 | Ht 73.0 in | Wt 373.0 lb

## 2019-10-25 DIAGNOSIS — Z0001 Encounter for general adult medical examination with abnormal findings: Secondary | ICD-10-CM

## 2019-10-25 DIAGNOSIS — M720 Palmar fascial fibromatosis [Dupuytren]: Secondary | ICD-10-CM

## 2019-10-25 DIAGNOSIS — I10 Essential (primary) hypertension: Secondary | ICD-10-CM

## 2019-10-25 DIAGNOSIS — E7800 Pure hypercholesterolemia, unspecified: Secondary | ICD-10-CM

## 2019-10-25 DIAGNOSIS — E119 Type 2 diabetes mellitus without complications: Secondary | ICD-10-CM | POA: Diagnosis not present

## 2019-10-25 DIAGNOSIS — Z0000 Encounter for general adult medical examination without abnormal findings: Secondary | ICD-10-CM

## 2019-10-25 NOTE — Progress Notes (Signed)
Subjective:    Patient ID: Christopher Spence, male    DOB: 08-16-1961, 59 y.o.   MRN: [id number]  HPI   Patient is here today for a complete physical exam.  Since I last saw the patient, he had to discontinue Invokana due to his insurance.  We had to switch it with Actos 30 mg a day.  As a result, the patient has gained substantial weight. Wt Readings from Last 3 Encounters:  10/25/19 (!) 373 lb (169.2 kg)  03/04/19 (!) 350 lb (158.8 kg)  12/03/18 (!) 358 lb (162.4 kg)   Patient's weight is up 23 pounds since June.  He is not exercising.  However he states that his diet has not changed considerably.  However he was unable to afford the Invokana.  Therefore he is on glipizide, Metformin, and Actos.  Fortunately his A1c is well controlled at 6.4.  His most recent lab work is listed below.  Of significance his HDL cholesterol is now greater than 60, his LDL cholesterol is now less than 100.  His PSA is stable at 2.7 and the remainder of his lab work looks excellent aside from some mild elevations in his triglycerides of 180.  Patient's colonoscopy is up-to-date.  Immunizations are listed below. Lab on 10/21/2019  Component Date Value Ref Range Status  . WBC 10/21/2019 5.2  3.8 - 10.8 Thousand/uL Final  . RBC 10/21/2019 4.91  4.20 - 5.80 Million/uL Final  . Hemoglobin 10/21/2019 14.7  13.2 - 17.1 g/dL Final  . HCT 10/21/2019 44.2  38.5 - 50.0 % Final  . MCV 10/21/2019 90.0  80.0 - 100.0 fL Final  . MCH 10/21/2019 29.9  27.0 - 33.0 pg Final  . MCHC 10/21/2019 33.3  32.0 - 36.0 g/dL Final  . RDW 10/21/2019 12.7  11.0 - 15.0 % Final  . Platelets 10/21/2019 194  140 - 400 Thousand/uL Final  . MPV 10/21/2019 11.7  7.5 - 12.5 fL Final  . Neutro Abs 10/21/2019 2,824  1,500 - 7,800 cells/uL Final  . Lymphs Abs 10/21/2019 1,602  850 - 3,900 cells/uL Final  . Absolute Monocytes 10/21/2019 603  200 - 950 cells/uL Final  . Eosinophils Absolute 10/21/2019 140  15 - 500 cells/uL Final  . Basophils Absolute  10/21/2019 31  0 - 200 cells/uL Final  . Neutrophils Relative % 10/21/2019 54.3  % Final  . Total Lymphocyte 10/21/2019 30.8  % Final  . Monocytes Relative 10/21/2019 11.6  % Final  . Eosinophils Relative 10/21/2019 2.7  % Final  . Basophils Relative 10/21/2019 0.6  % Final  . Glucose, Bld 10/21/2019 146* 65 - 99 mg/dL Final   Comment: .            Fasting reference interval . For someone without known diabetes, a glucose value >125 mg/dL indicates that they may have diabetes and this should be confirmed with a follow-up test. .   . BUN 10/21/2019 16  7 - 25 mg/dL Final  . Creat 10/21/2019 0.69* 0.70 - 1.25 mg/dL Final   Comment: For patients >89 years of age, the reference limit for Creatinine is approximately 13% higher for people identified as African-American. .   Havery Moros Ratio 10/21/2019 23* 6 - 22 (calc) Final  . Sodium 10/21/2019 142  135 - 146 mmol/L Final  . Potassium 10/21/2019 4.9  3.5 - 5.3 mmol/L Final  . Chloride 10/21/2019 106  98 - 110 mmol/L Final  . CO2 10/21/2019 29  20 - 32  mmol/L Final  . Calcium 10/21/2019 9.4  8.6 - 10.3 mg/dL Final  . Total Protein 10/21/2019 6.4  6.1 - 8.1 g/dL Final  . Albumin 10/21/2019 4.1  3.6 - 5.1 g/dL Final  . Globulin 10/21/2019 2.3  1.9 - 3.7 g/dL (calc) Final  . AG Ratio 10/21/2019 1.8  1.0 - 2.5 (calc) Final  . Total Bilirubin 10/21/2019 0.8  0.2 - 1.2 mg/dL Final  . Alkaline phosphatase (APISO) 10/21/2019 59  35 - 144 U/L Final  . AST 10/21/2019 16  10 - 35 U/L Final  . ALT 10/21/2019 28  9 - 46 U/L Final  . Cholesterol 10/21/2019 137  <200 mg/dL Final  . HDL 10/21/2019 62  > OR = 40 mg/dL Final  . Triglycerides 10/21/2019 188* <150 mg/dL Final  . LDL Cholesterol (Calc) 10/21/2019 49  mg/dL (calc) Final   Comment: Reference range: <100 . Desirable range <100 mg/dL for primary prevention;   <70 mg/dL for patients with CHD or diabetic patients  with > or = 2 CHD risk factors. Marland Kitchen LDL-C is now calculated using  the Martin-Hopkins  calculation, which is a validated novel method providing  better accuracy than the Friedewald equation in the  estimation of LDL-C.  Cresenciano Genre et al. Annamaria Helling. [id number]): 2061-2068  (http://education.[facility].com/faq/FAQ164)   . Total CHOL/HDL Ratio 10/21/2019 2.2  <5.0 (calc) Final  . Non-HDL Cholesterol (Calc) 10/21/2019 75  <130 mg/dL (calc) Final   Comment: For patients with diabetes plus 1 major ASCVD risk  factor, treating to a non-HDL-C goal of <100 mg/dL  (LDL-C of <70 mg/dL) is considered a therapeutic  option.   Marland Kitchen PSA 10/21/2019 2.7  < OR = 4.0 ng/mL Final   Comment: The total PSA value from this assay system is  standardized against the WHO standard. The test  result will be approximately 20% lower when compared  to the equimolar-standardized total PSA (Beckman  Coulter). Comparison of serial PSA results should be  interpreted with this fact in mind. . This test was performed using the Siemens  chemiluminescent method. Values obtained from  different assay methods cannot be used interchangeably. PSA levels, regardless of value, should not be interpreted as absolute evidence of the presence or absence of disease.   . Hgb A1c MFr Bld 10/21/2019 6.4* <5.7 % of total Hgb Final   Comment: For someone without known diabetes, a hemoglobin  A1c value between 5.7% and 6.4% is consistent with prediabetes and should be confirmed with a  follow-up test. . For someone with known diabetes, a value <7% indicates that their diabetes is well controlled. A1c targets should be individualized based on duration of diabetes, age, comorbid conditions, and other considerations. . This assay result is consistent with an increased risk of diabetes. . Currently, no consensus exists regarding use of hemoglobin A1c for diagnosis of diabetes for children. .   . Mean Plasma Glucose 10/21/2019 137  (calc) Final  . eAG (mmol/L) 10/21/2019 7.6  (calc) Final    Immunization History  Administered Date(s) Administered  . Pneumococcal Polysaccharide-23 10/06/2014  . Tdap 08/03/2017     Past Medical History:  Diagnosis Date  . Allergy    occ- mild  . Arthritis    knees  . Diabetes ([place])   . Hyperlipidemia   . Hypertension   . Personal history of colonic polyps-adenomas 10/15/2012   Past Surgical History:  Procedure Laterality Date  . COLONOSCOPY  10/15/2012   Procedure: COLONOSCOPY;  Surgeon: Gatha Mayer, MD;  Location: [facility];  Service: Endoscopy;  Laterality: N/A;  . COLONOSCOPY    . KNEE ARTHROSCOPY     bilateral; twice on each knee  . KNEE ARTHROTOMY  1976   right  . POLYPECTOMY    . UMBILICAL HERNIA REPAIR     x 2   Current Outpatient Medications on File Prior to Visit  Medication Sig Dispense Refill  . amLODipine (NORVASC) 10 MG tablet Take 1 tablet (10 mg total) by mouth daily. 30 tablet 0  . aspirin 81 MG tablet Take 81 mg by mouth daily.    Marland Kitchen atorvastatin (LIPITOR) 20 MG tablet TAKE 1 TABLET BY MOUTH EVERY DAY 30 tablet 5  . benzonatate (TESSALON) 200 MG capsule Take 1 capsule (200 mg total) by mouth every 8 (eight) hours. 30 capsule 0  . canagliflozin (INVOKANA) 300 MG TABS tablet TAKE 1 TABLET BY MOUTH EVERY DAY BEFORE BREAKFAST 30 tablet 0  . doxazosin (CARDURA) 4 MG tablet Take 1 tablet (4 mg total) by mouth daily. 30 tablet 0  . fish oil-omega-3 fatty acids 1000 MG capsule Take by mouth daily. Takes 2 capsules daily    . glipiZIDE (GLUCOTROL) 5 MG tablet Take 1 tablet (5 mg total) by mouth daily before breakfast. 30 tablet 0  . Green Tea, Camillia sinensis, (GREEN TEA PO) Take by mouth. Takes 2 green tea capsules daily    . hydrochlorothiazide (HYDRODIURIL) 12.5 MG tablet Take 1 tablet (12.5 mg total) by mouth daily. 30 tablet 0  . losartan (COZAAR) 100 MG tablet Take 1 tablet (100 mg total) by mouth daily. 30 tablet 0  . metFORMIN (GLUCOPHAGE) 1000 MG tablet Take 1 tablet (1,000 mg total) by mouth 2 (two)  times daily with a meal. 60 tablet 0  . neomycin-polymyxin-hydrocortisone (CORTISPORIN) OTIC solution Place 3 drops into both ears 4 (four) times daily. 10 mL 0  . pioglitazone (ACTOS) 30 MG tablet Take 1 tablet (30 mg total) by mouth daily. Needs office visit and labs before further refills 30 tablet 0   No current facility-administered medications on file prior to visit.   No Known Allergies Social History   Socioeconomic History  . Marital status: Married    Spouse name: Not on file  . Number of children: Not on file  . Years of education: Not on file  . Highest education level: Not on file  Occupational History  . Not on file  Tobacco Use  . Smoking status: Never Smoker  . Smokeless tobacco: Never Used  Substance and Sexual Activity  . Alcohol use: Yes    Alcohol/week: 35.0 standard drinks    Types: 35 Cans of beer per week    Comment: 4-5 times weekly,4 to 5 beers  . Drug use: No  . Sexual activity: Yes    Comment: married  Other Topics Concern  . Not on file  Social History Narrative  . Not on file   Social Determinants of Health   Financial Resource Strain:   . Difficulty of Paying Living Expenses: Not on file  Food Insecurity:   . Worried About Charity fundraiser in the Last Year: Not on file  . Ran Out of Food in the Last Year: Not on file  Transportation Needs:   . Lack of Transportation (Medical): Not on file  . Lack of Transportation (Non-Medical): Not on file  Physical Activity:   . Days of Exercise per Week: Not on file  . Minutes of Exercise per Session: Not on file  Stress:   . Feeling of Stress : Not on file  Social Connections:   . Frequency of Communication with Friends and Family: Not on file  . Frequency of Social Gatherings with Friends and Family: Not on file  . Attends Religious Services: Not on file  . Active Member of Clubs or Organizations: Not on file  . Attends Archivist Meetings: Not on file  . Marital Status: Not on file   Intimate Partner Violence:   . Fear of Current or Ex-Partner: Not on file  . Emotionally Abused: Not on file  . Physically Abused: Not on file  . Sexually Abused: Not on file   Family History  Problem Relation Age of Onset  . Cancer Mother        breast  . Breast cancer Mother   . Cancer Father        not sure primary source   . Colon cancer Neg Hx   . Stomach cancer Neg Hx   . Colon polyps Neg Hx   . Esophageal cancer Neg Hx   . Rectal cancer Neg Hx       Review of Systems  All other systems reviewed and are negative.      Objective:   Physical Exam  Constitutional: He is oriented to person, place, and time. He appears well-developed and well-nourished. No distress.  HENT:  Head: Normocephalic and atraumatic.  Right Ear: External ear normal.  Left Ear: External ear normal.  Nose: Nose normal.  Mouth/Throat: Oropharynx is clear and moist. No oropharyngeal exudate.  Eyes: Pupils are equal, round, and reactive to light. Conjunctivae and EOM are normal. Right eye exhibits no discharge. Left eye exhibits no discharge. No scleral icterus.  Neck: No JVD present. No tracheal deviation present. No thyromegaly present.  Cardiovascular: Normal rate, regular rhythm, normal heart sounds and intact distal pulses. Exam reveals no gallop and no friction rub.  No murmur heard. Pulmonary/Chest: Effort normal and breath sounds normal. No stridor. No respiratory distress. He has no wheezes. He has no rales. He exhibits no tenderness.  Abdominal: Soft. Bowel sounds are normal. He exhibits no distension and no mass. There is no abdominal tenderness. There is no rebound and no guarding.  Musculoskeletal:        General: Edema present.     Cervical back: Neck supple.  Lymphadenopathy:    He has no cervical adenopathy.  Neurological: He is alert and oriented to person, place, and time. He has normal reflexes. No cranial nerve deficit. He exhibits normal muscle tone. Coordination normal.   Skin: Skin is warm. No rash noted. He is not diaphoretic. No erythema. No pallor.  Psychiatric: He has a normal mood and affect. His behavior is normal. Judgment and thought content normal.  Vitals reviewed.         Assessment & Plan:  Routine general medical examination at a health care facility  Pure hypercholesterolemia  Essential hypertension  Controlled type 2 diabetes mellitus without complication, without long-term current use of insulin ([place])  Dupuytren contracture - Plan: Ambulatory referral to Hand Surgery  I would love to switch the patient off the Actos to something that could facilitate weight loss.  The patient's morbid obesity is his biggest risk factor now for future long-term morbidity and mortality.  Patient will be an excellent candidate for Ozempic or Rybelsus.  I have asked the patient to call his insurance and get a list of approved medications.  If there is a drug that  can facilitate weight loss that is covered I would most likely switch the Actos to this.  Again I recommended 30 minutes a day 5 days a week of aerobic exercise as well as a 1500-calorie a day or less diet.  PSA is stable.  Colonoscopy is up-to-date.  Immunizations are up-to-date.  Regular anticipatory guidance is provided.  Diabetic foot exam was performed today and is normal.

## 2019-11-08 ENCOUNTER — Encounter: Payer: Self-pay | Admitting: Family Medicine

## 2019-11-11 MED ORDER — EMPAGLIFLOZIN 25 MG PO TABS: 25.0000 mg | ORAL_TABLET | Freq: Every day | ORAL | 3 refills | Status: DC

## 2019-11-12 MED ORDER — EMPAGLIFLOZIN 25 MG PO TABS: 25.0000 mg | ORAL_TABLET | Freq: Every day | ORAL | 3 refills | Status: DC

## 2019-11-12 NOTE — Addendum Note (Signed)
Addended by: Shary Decamp B on: 11/12/2019 03:42 PM   Modules accepted: Orders

## 2019-11-13 ENCOUNTER — Telehealth: Payer: Self-pay | Admitting: *Deleted

## 2019-11-13 MED ORDER — EMPAGLIFLOZIN 25 MG PO TABS: 25.0000 mg | ORAL_TABLET | Freq: Every day | ORAL | 3 refills | Status: DC

## 2019-11-13 NOTE — Telephone Encounter (Signed)
Received request from pharmacy for [place] on [address].   PA submitted.   Dx: E11.9- DM.   Patient has had therapeutic failure to MTF (A1C remains elevated at 6.4%).

## 2019-11-13 NOTE — Telephone Encounter (Signed)
[facility] is reviewing your PA request. Typically an electronic response will be received within 72 hours. To check for an update later, open this request from your dashboard.    You may close this dialog and return to your dashboard to perform other tasks.

## 2019-11-13 NOTE — Telephone Encounter (Signed)
Received PA determination.   PA- [id number] approved 11/13/2019- 11/12/2020.  Pharmacy made aware.

## 2019-11-19 ENCOUNTER — Other Ambulatory Visit: Payer: Self-pay | Admitting: Family Medicine

## 2019-11-19 MED ORDER — DOXAZOSIN MESYLATE 4 MG PO TABS: 4.0000 mg | ORAL_TABLET | Freq: Every day | ORAL | 1 refills | Status: DC

## 2019-11-19 MED ORDER — AMLODIPINE BESYLATE 10 MG PO TABS: 10.0000 mg | ORAL_TABLET | Freq: Every day | ORAL | 1 refills | Status: DC

## 2019-11-19 MED ORDER — GLIPIZIDE 5 MG PO TABS: 5.0000 mg | ORAL_TABLET | Freq: Every day | ORAL | 1 refills | Status: DC

## 2019-11-19 MED ORDER — PIOGLITAZONE HCL 30 MG PO TABS: 30.0000 mg | ORAL_TABLET | Freq: Every day | ORAL | 1 refills | Status: DC

## 2019-11-19 MED ORDER — HYDROCHLOROTHIAZIDE 12.5 MG PO TABS: 12.5000 mg | ORAL_TABLET | Freq: Every day | ORAL | 1 refills | Status: DC

## 2019-11-19 MED ORDER — METFORMIN HCL 1000 MG PO TABS: 1000.0000 mg | ORAL_TABLET | Freq: Two times a day (BID) | ORAL | 1 refills | Status: DC

## 2019-11-19 MED ORDER — ATORVASTATIN CALCIUM 20 MG PO TABS: 20.0000 mg | ORAL_TABLET | Freq: Every day | ORAL | 1 refills | Status: DC

## 2019-11-19 MED ORDER — LOSARTAN POTASSIUM 100 MG PO TABS: 100.0000 mg | ORAL_TABLET | Freq: Every day | ORAL | 1 refills | Status: DC

## 2020-02-21 ENCOUNTER — Other Ambulatory Visit: Payer: Self-pay | Admitting: Family Medicine

## 2020-05-25 ENCOUNTER — Other Ambulatory Visit: Payer: Self-pay | Admitting: Family Medicine

## 2020-05-28 ENCOUNTER — Other Ambulatory Visit: Payer: Self-pay | Admitting: Family Medicine

## 2020-05-28 NOTE — Telephone Encounter (Signed)
Prescription sent to pharmacy.

## 2020-05-28 NOTE — Telephone Encounter (Signed)
Patient left vm requesting all of his medications called in. He is going out of town on Saturday and will be out of town for a month.  CB# [phone]

## 2020-07-27 ENCOUNTER — Other Ambulatory Visit: Payer: Self-pay | Admitting: Family Medicine

## 2020-08-25 ENCOUNTER — Other Ambulatory Visit: Payer: Self-pay | Admitting: Family Medicine

## 2020-08-31 ENCOUNTER — Other Ambulatory Visit: Payer: Self-pay | Admitting: Family Medicine

## 2020-10-28 ENCOUNTER — Other Ambulatory Visit: Payer: Self-pay | Admitting: Family Medicine

## 2020-10-30 ENCOUNTER — Other Ambulatory Visit: Payer: Self-pay | Admitting: Family Medicine

## 2020-11-25 ENCOUNTER — Other Ambulatory Visit: Payer: Self-pay | Admitting: Family Medicine

## 2020-12-02 ENCOUNTER — Telehealth: Payer: Self-pay

## 2020-12-02 MED ORDER — AMLODIPINE BESYLATE 10 MG PO TABS: ORAL_TABLET | ORAL | 0 refills | Status: DC

## 2020-12-02 MED ORDER — ATORVASTATIN CALCIUM 20 MG PO TABS: ORAL_TABLET | ORAL | 0 refills | Status: DC

## 2020-12-02 MED ORDER — LOSARTAN POTASSIUM 100 MG PO TABS: ORAL_TABLET | ORAL | 0 refills | Status: DC

## 2020-12-02 MED ORDER — DOXAZOSIN MESYLATE 4 MG PO TABS: ORAL_TABLET | ORAL | 0 refills | Status: DC

## 2020-12-02 MED ORDER — GLIPIZIDE 5 MG PO TABS: ORAL_TABLET | ORAL | 0 refills | Status: DC

## 2020-12-02 MED ORDER — METFORMIN HCL 1000 MG PO TABS: ORAL_TABLET | ORAL | 0 refills | Status: DC

## 2020-12-02 MED ORDER — HYDROCHLOROTHIAZIDE 12.5 MG PO TABS: ORAL_TABLET | ORAL | 0 refills | Status: DC

## 2020-12-02 MED ORDER — EMPAGLIFLOZIN 25 MG PO TABS: 25.0000 mg | ORAL_TABLET | Freq: Every day | ORAL | 0 refills | Status: DC

## 2020-12-02 NOTE — Telephone Encounter (Signed)
Patient called needs all his 8 meds refill  amLODipine (NORVASC) 10 MG tablet  atorvastatin (LIPITOR) 20 MG tablet  doxazosin (CARDURA) 4 MG tablet losartan (COZAAR) 100 MG tablet  [place] 25 MG hydrochlorothiazide (HYDRODIURIL) 12.5 MG metFORMIN (GLUCOPHAGE) 1000 MG tablet glipiZIDE (GLUCOTROL) 5 MG tablet  Pharmacy: [facility], [address]  [place], [place] [id number]

## 2020-12-02 NOTE — Telephone Encounter (Signed)
Medication has been sent.  

## 2021-01-04 ENCOUNTER — Other Ambulatory Visit: Payer: Self-pay | Admitting: Family Medicine

## 2021-01-14 ENCOUNTER — Telehealth: Payer: Self-pay | Admitting: Family Medicine

## 2021-01-14 NOTE — Telephone Encounter (Signed)
Yes.   Patient has not been seen in our office >1 year.

## 2021-01-14 NOTE — Telephone Encounter (Signed)
Patient called to follow up on letter received to schedule a follow up appt. Patient sent over lab results recently and wants to know if appt is still needed. Please advise at [phone].

## 2021-01-19 NOTE — Telephone Encounter (Signed)
Call placed to patient.   Advised that policy requires OV within 1 year for refills.   Agreeable to plan.   Appointment scheduled.

## 2021-01-19 NOTE — Telephone Encounter (Signed)
Patient still disputing need for in-office appointment and is requesting a call back. Please advise at [phone].

## 2021-01-22 DIAGNOSIS — M720 Palmar fascial fibromatosis [Dupuytren]: Secondary | ICD-10-CM | POA: Insufficient documentation

## 2021-02-01 ENCOUNTER — Other Ambulatory Visit: Payer: Self-pay

## 2021-02-01 ENCOUNTER — Ambulatory Visit (INDEPENDENT_AMBULATORY_CARE_PROVIDER_SITE_OTHER): Payer: 59 | Admitting: Family Medicine

## 2021-02-01 ENCOUNTER — Encounter: Payer: Self-pay | Admitting: Family Medicine

## 2021-02-01 VITALS — BP 140/80 | HR 99 | Temp 97.3°F | Ht 73.0 in | Wt 370.0 lb

## 2021-02-01 DIAGNOSIS — E119 Type 2 diabetes mellitus without complications: Secondary | ICD-10-CM | POA: Diagnosis not present

## 2021-02-01 DIAGNOSIS — Z125 Encounter for screening for malignant neoplasm of prostate: Secondary | ICD-10-CM

## 2021-02-01 DIAGNOSIS — E7800 Pure hypercholesterolemia, unspecified: Secondary | ICD-10-CM

## 2021-02-01 DIAGNOSIS — Z0001 Encounter for general adult medical examination with abnormal findings: Secondary | ICD-10-CM | POA: Diagnosis not present

## 2021-02-01 DIAGNOSIS — I10 Essential (primary) hypertension: Secondary | ICD-10-CM

## 2021-02-01 DIAGNOSIS — Z23 Encounter for immunization: Secondary | ICD-10-CM

## 2021-02-01 DIAGNOSIS — Z0000 Encounter for general adult medical examination without abnormal findings: Secondary | ICD-10-CM

## 2021-02-01 NOTE — Progress Notes (Signed)
Subjective:    Patient ID: Christopher Spence, male    DOB: 02-05-1961, 60 y.o.   MRN: [id number]  HPI  Patient is a very pleasant 60 year old Caucasian male here today for complete physical exam.  I have not seen the patient since January 2021.  He states that he had lab work done that was excellent in between them but I do not have any records of this that I can see.  Therefore he is overdue for a PSA, hemoglobin A1c, CBC, CMP, lipid panel, and a urine microalbumin.  His last colonoscopy was in 2020 and they recommended a repeat colonoscopy in 2025.  He is due for a booster on his pneumonia shot due to his diabetes.  He is also due for the shingles vaccine.  He has had 3 of the COVID vaccines and has also had COVID.  My biggest concern today is the patient's BMI which is 48.  He is at high risk for obstructive sleep apnea.  He has a large neck circumference, short mandible, and obviously an elevated BMI.  We discussed a screening for obstructive sleep apnea however the patient declines this at the present time.  Immunization History  Administered Date(s) Administered  . Pneumococcal Polysaccharide-23 10/06/2014  . Tdap 08/03/2017     Past Medical History:  Diagnosis Date  . Allergy    occ- mild  . Arthritis    knees  . Diabetes ([place])   . Hyperlipidemia   . Hypertension   . Personal history of colonic polyps-adenomas 10/15/2012   Past Surgical History:  Procedure Laterality Date  . COLONOSCOPY  10/15/2012   Procedure: COLONOSCOPY;  Surgeon: Gatha Mayer, MD;  Location: [facility];  Service: Endoscopy;  Laterality: N/A;  . COLONOSCOPY    . KNEE ARTHROSCOPY     bilateral; twice on each knee  . KNEE ARTHROTOMY  1976   right  . POLYPECTOMY    . UMBILICAL HERNIA REPAIR     x 2   Current Outpatient Medications on File Prior to Visit  Medication Sig Dispense Refill  . amLODipine (NORVASC) 10 MG tablet TAKE 1 TABLET(10 MG) BY MOUTH DAILY 90 tablet 0  . aspirin 81 MG tablet Take 81  mg by mouth daily.    Marland Kitchen atorvastatin (LIPITOR) 20 MG tablet TAKE 1 TABLET(20 MG) BY MOUTH DAILY 90 tablet 0  . doxazosin (CARDURA) 4 MG tablet TAKE 1 TABLET(4 MG) BY MOUTH DAILY 90 tablet 0  . fish oil-omega-3 fatty acids 1000 MG capsule Take by mouth daily. Takes 2 capsules daily    . glipiZIDE (GLUCOTROL) 5 MG tablet TAKE 1 TABLET(5 MG) BY MOUTH DAILY BEFORE BREAKFAST 90 tablet 0  . Green Tea, Camillia sinensis, (GREEN TEA PO) Take by mouth. Takes 2 green tea capsules daily    . hydrochlorothiazide (HYDRODIURIL) 12.5 MG tablet TAKE 1 TABLET(12.5 MG) BY MOUTH DAILY 90 tablet 0  . JARDIANCE 25 MG TABS tablet TAKE 1 TABLET(25 MG) BY MOUTH DAILY BEFORE BREAKFAST 30 tablet 0  . losartan (COZAAR) 100 MG tablet TAKE 1 TABLET(100 MG) BY MOUTH DAILY 90 tablet 0  . metFORMIN (GLUCOPHAGE) 1000 MG tablet TAKE 1 TABLET(1000 MG) BY MOUTH TWICE DAILY WITH A MEAL 90 tablet 0  . pioglitazone (ACTOS) 30 MG tablet Take 1 tablet (30 mg total) by mouth daily. (Patient not taking: Reported on 02/01/2021) 90 tablet 1   No current facility-administered medications on file prior to visit.   No Known Allergies Social History   Socioeconomic  History  . Marital status: Married    Spouse name: Not on file  . Number of children: Not on file  . Years of education: Not on file  . Highest education level: Not on file  Occupational History  . Not on file  Tobacco Use  . Smoking status: Never Smoker  . Smokeless tobacco: Never Used  Vaping Use  . Vaping Use: Never used  Substance and Sexual Activity  . Alcohol use: Yes    Alcohol/week: 35.0 standard drinks    Types: 35 Cans of beer per week    Comment: 4-5 times weekly,4 to 5 beers  . Drug use: No  . Sexual activity: Yes    Comment: married  Other Topics Concern  . Not on file  Social History Narrative  . Not on file   Social Determinants of Health   Financial Resource Strain: Not on file  Food Insecurity: Not on file  Transportation Needs: Not on file   Physical Activity: Not on file  Stress: Not on file  Social Connections: Not on file  Intimate Partner Violence: Not on file   Family History  Problem Relation Age of Onset  . Cancer Mother        breast  . Breast cancer Mother   . Cancer Father        not sure primary source   . Colon cancer Neg Hx   . Stomach cancer Neg Hx   . Colon polyps Neg Hx   . Esophageal cancer Neg Hx   . Rectal cancer Neg Hx       Review of Systems  All other systems reviewed and are negative.      Objective:   Physical Exam Vitals reviewed.  Constitutional:      General: He is not in acute distress.    Appearance: Normal appearance. He is well-developed. He is obese. He is not ill-appearing, toxic-appearing or diaphoretic.  HENT:     Head: Normocephalic and atraumatic.     Right Ear: Tympanic membrane, ear canal and external ear normal.     Left Ear: Tympanic membrane, ear canal and external ear normal.     Nose: Nose normal. No congestion or rhinorrhea.     Mouth/Throat:     Pharynx: No oropharyngeal exudate.  Eyes:     General: No scleral icterus.       Right eye: No discharge.        Left eye: No discharge.     Conjunctiva/sclera: Conjunctivae normal.     Pupils: Pupils are equal, round, and reactive to light.  Neck:     Thyroid: No thyromegaly.     Vascular: No carotid bruit or JVD.     Trachea: No tracheal deviation.  Cardiovascular:     Rate and Rhythm: Normal rate and regular rhythm.     Pulses: Normal pulses.     Heart sounds: Normal heart sounds. No murmur heard. No friction rub. No gallop.   Pulmonary:     Effort: Pulmonary effort is normal. No respiratory distress.     Breath sounds: Normal breath sounds. No stridor. No wheezing, rhonchi or rales.  Chest:     Chest wall: No tenderness.  Abdominal:     General: Bowel sounds are normal. There is no distension.     Palpations: Abdomen is soft. There is no mass.     Tenderness: There is no abdominal tenderness. There  is no guarding or rebound.  Musculoskeletal:  Cervical back: Neck supple. No rigidity or tenderness.     Right lower leg: No edema.     Left lower leg: No edema.  Lymphadenopathy:     Cervical: No cervical adenopathy.  Skin:    General: Skin is warm.     Coloration: Skin is not jaundiced or pale.     Findings: No bruising, erythema, lesion or rash.  Neurological:     General: No focal deficit present.     Mental Status: He is alert and oriented to person, place, and time. Mental status is at baseline.     Cranial Nerves: No cranial nerve deficit.     Sensory: No sensory deficit.     Motor: No weakness or abnormal muscle tone.     Coordination: Coordination normal.     Gait: Gait normal.     Deep Tendon Reflexes: Reflexes are normal and symmetric. Reflexes normal.  Psychiatric:        Behavior: Behavior normal.        Thought Content: Thought content normal.        Judgment: Judgment normal.           Assessment & Plan:  Routine general medical examination at a health care facility - Plan: Hemoglobin A1c, CBC with Differential/Platelet, COMPLETE METABOLIC PANEL WITH GFR, Lipid panel, Microalbumin, urine  Controlled type 2 diabetes mellitus without complication, without long-term current use of insulin (HCC) - Plan: Hemoglobin A1c, CBC with Differential/Platelet, COMPLETE METABOLIC PANEL WITH GFR, Lipid panel, Microalbumin, urine  Pure hypercholesterolemia  Essential hypertension  Prostate cancer screening - Plan: PSA  I am very concerned about the patient's BMI.  Obviously recommend diet exercise and weight loss.  However I am also concerned he most likely has severe obstructive sleep apnea.  I explained that this can cause heart damage over time and lead to serious medical comorbidities.  However the patient politely declines a sleep study.  If he changes his mind I will be glad to schedule it for him.  I will check an A1c.  Goal A1c is less than 6.5.  Check a CBC, CMP,  and lipid panel.  Goal LDL cholesterol is less than 100.  Check a urine microalbumin.  Goal albumin to creatinine ratio is less than 30.  Check a PSA to screen for prostate cancer.  Colon cancer screening is up-to-date.  Recommended Shingrix.  Recommended a booster on his COVID-vaccine as well.  He received a booster on Pneumovax 23 today.

## 2021-02-01 NOTE — Addendum Note (Signed)
Addended by: Dessie Coma on: 02/01/2021 09:58 AM   Modules accepted: Orders

## 2021-02-02 LAB — CBC WITH DIFFERENTIAL/PLATELET
Absolute Monocytes: 611 cells/uL (ref 200–950)
Basophils Absolute: 28 cells/uL (ref 0–200)
Basophils Relative: 0.5 %
Eosinophils Absolute: 110 cells/uL (ref 15–500)
Eosinophils Relative: 2 %
HCT: 50.3 % — ABNORMAL HIGH (ref 38.5–50.0)
Hemoglobin: 15.8 g/dL (ref 13.2–17.1)
Lymphs Abs: 1447 cells/uL (ref 850–3900)
MCH: 28 pg (ref 27.0–33.0)
MCHC: 31.4 g/dL — ABNORMAL LOW (ref 32.0–36.0)
MCV: 89.2 fL (ref 80.0–100.0)
MPV: 11.7 fL (ref 7.5–12.5)
Monocytes Relative: 11.1 %
Neutro Abs: 3306 cells/uL (ref 1500–7800)
Neutrophils Relative %: 60.1 %
Platelets: 185 10*3/uL (ref 140–400)
RBC: 5.64 10*6/uL (ref 4.20–5.80)
RDW: 12.1 % (ref 11.0–15.0)
Total Lymphocyte: 26.3 %
WBC: 5.5 10*3/uL (ref 3.8–10.8)

## 2021-02-02 LAB — COMPLETE METABOLIC PANEL WITH GFR
AG Ratio: 1.5 (calc) (ref 1.0–2.5)
ALT: 54 U/L — ABNORMAL HIGH (ref 9–46)
AST: 28 U/L (ref 10–35)
Albumin: 4.1 g/dL (ref 3.6–5.1)
Alkaline phosphatase (APISO): 67 U/L (ref 35–144)
BUN: 15 mg/dL (ref 7–25)
CO2: 21 mmol/L (ref 20–32)
Calcium: 9.4 mg/dL (ref 8.6–10.3)
Chloride: 105 mmol/L (ref 98–110)
Creat: 0.8 mg/dL (ref 0.70–1.25)
GFR, Est African American: 112 mL/min/{1.73_m2} (ref >=60)
GFR, Est Non African American: 96 mL/min/{1.73_m2} (ref >=60)
Globulin: 2.7 g/dL (calc) (ref 1.9–3.7)
Glucose, Bld: 218 mg/dL — ABNORMAL HIGH (ref 65–99)
Potassium: 4.5 mmol/L (ref 3.5–5.3)
Sodium: 144 mmol/L (ref 135–146)
Total Bilirubin: 0.8 mg/dL (ref 0.2–1.2)
Total Protein: 6.8 g/dL (ref 6.1–8.1)

## 2021-02-02 LAB — HEMOGLOBIN A1C
Hgb A1c MFr Bld: 6.9 % of total Hgb — ABNORMAL HIGH (ref <5.7)
Mean Plasma Glucose: 151 mg/dL
eAG (mmol/L): 8.4 mmol/L

## 2021-02-02 LAB — PSA: PSA: 2.85 ng/mL (ref <=4.00)

## 2021-02-02 LAB — LIPID PANEL
Cholesterol: 133 mg/dL (ref <200)
HDL: 50 mg/dL (ref >=40)
LDL Cholesterol (Calc): 55 mg/dL (calc)
Non-HDL Cholesterol (Calc): 83 mg/dL (calc) (ref <130)
Total CHOL/HDL Ratio: 2.7 (calc) (ref <5.0)
Triglycerides: 211 mg/dL — ABNORMAL HIGH (ref <150)

## 2021-02-02 LAB — MICROALBUMIN, URINE: Microalb, Ur: 0.2 mg/dL

## 2021-02-03 ENCOUNTER — Encounter: Payer: Self-pay | Admitting: *Deleted

## 2021-02-04 ENCOUNTER — Other Ambulatory Visit: Payer: Self-pay | Admitting: Family Medicine

## 2021-02-07 ENCOUNTER — Other Ambulatory Visit: Payer: Self-pay | Admitting: Family Medicine

## 2021-03-10 ENCOUNTER — Telehealth: Payer: Self-pay | Admitting: Family Medicine

## 2021-03-10 ENCOUNTER — Other Ambulatory Visit: Payer: Self-pay | Admitting: Family Medicine

## 2021-03-10 MED ORDER — EMPAGLIFLOZIN 25 MG PO TABS: ORAL_TABLET | ORAL | 1 refills | Status: DC

## 2021-03-10 MED ORDER — METFORMIN HCL 1000 MG PO TABS: 1000.0000 mg | ORAL_TABLET | Freq: Two times a day (BID) | ORAL | 1 refills | Status: DC

## 2021-03-10 MED ORDER — LOSARTAN POTASSIUM 100 MG PO TABS: ORAL_TABLET | ORAL | 1 refills | Status: DC

## 2021-03-10 MED ORDER — HYDROCHLOROTHIAZIDE 12.5 MG PO TABS: ORAL_TABLET | ORAL | 1 refills | Status: DC

## 2021-03-10 MED ORDER — AMLODIPINE BESYLATE 10 MG PO TABS: ORAL_TABLET | ORAL | 1 refills | Status: DC

## 2021-03-10 MED ORDER — DOXAZOSIN MESYLATE 4 MG PO TABS: ORAL_TABLET | ORAL | 1 refills | Status: DC

## 2021-03-10 MED ORDER — ATORVASTATIN CALCIUM 20 MG PO TABS: ORAL_TABLET | ORAL | 1 refills | Status: DC

## 2021-03-10 MED ORDER — GLIPIZIDE 5 MG PO TABS: 5.0000 mg | ORAL_TABLET | Freq: Every day | ORAL | 1 refills | Status: DC

## 2021-03-10 NOTE — Telephone Encounter (Signed)
Noted.   [facility]oved [facility] from profile.   Prescription sent to [facility] pharmacy.

## 2021-03-10 NOTE — Telephone Encounter (Signed)
Prescription sent to pharmacy.

## 2021-03-10 NOTE — Telephone Encounter (Signed)
Pt called in asking for refills of all these meds,  glipiZIDE (GLUCOTROL) 5 MG tablet  metFORMIN (GLUCOPHAGE) 1000 MG  JARDIANCE 25 MG TABS tablet  losartan (COZAAR) 100 MG tablet hydrochlorothiazide (HYDRODIURIL) 12.5 MG  amLODipine (NORVASC) 10 MG tablet  atorvastatin (LIPITOR) 20 MG tablet  doxazosin (CARDURA) 4 MG tablet   To be sent to pharmacy Cb#: [phone]

## 2021-03-10 NOTE — Telephone Encounter (Signed)
Pt called back about his refills that were sent in to pharmacy today 6/15. Pt stated that he no longer uses [facility], he now uses [facility] on [address]. Pt asks if these refill could please be resent to this pharmacy  Cb #: [phone]

## 2021-03-18 ENCOUNTER — Other Ambulatory Visit: Payer: Self-pay | Admitting: *Deleted

## 2021-03-18 MED ORDER — HYDROCHLOROTHIAZIDE 12.5 MG PO TABS: ORAL_TABLET | ORAL | 1 refills | Status: DC

## 2021-03-18 MED ORDER — METFORMIN HCL 1000 MG PO TABS: 1000.0000 mg | ORAL_TABLET | Freq: Two times a day (BID) | ORAL | 1 refills | Status: DC

## 2021-03-22 ENCOUNTER — Other Ambulatory Visit: Payer: Self-pay

## 2021-03-24 ENCOUNTER — Telehealth: Payer: Self-pay | Admitting: *Deleted

## 2021-03-24 NOTE — Telephone Encounter (Signed)
Patient due for Cologuard re-screen.   Order placed via [facility].   Cologuard (Order [id number])

## 2021-04-21 ENCOUNTER — Other Ambulatory Visit: Payer: Self-pay | Admitting: Family Medicine

## 2021-04-21 MED ORDER — AMLODIPINE BESYLATE 10 MG PO TABS: ORAL_TABLET | ORAL | 3 refills | Status: DC

## 2021-04-21 MED ORDER — EMPAGLIFLOZIN 25 MG PO TABS: ORAL_TABLET | ORAL | 3 refills | Status: DC

## 2021-04-21 MED ORDER — METFORMIN HCL 1000 MG PO TABS: 1000.0000 mg | ORAL_TABLET | Freq: Two times a day (BID) | ORAL | 3 refills | Status: DC

## 2021-04-21 MED ORDER — PIOGLITAZONE HCL 30 MG PO TABS: 30.0000 mg | ORAL_TABLET | Freq: Every day | ORAL | 3 refills | Status: DC

## 2021-04-21 MED ORDER — LOSARTAN POTASSIUM 100 MG PO TABS: ORAL_TABLET | ORAL | 3 refills | Status: DC

## 2021-04-21 MED ORDER — DOXAZOSIN MESYLATE 4 MG PO TABS: ORAL_TABLET | ORAL | 3 refills | Status: DC

## 2021-05-24 ENCOUNTER — Telehealth: Payer: Self-pay

## 2021-05-24 NOTE — Telephone Encounter (Signed)
Routed to provider

## 2021-05-24 NOTE — Telephone Encounter (Signed)
Pt sent in to office an application for a Handicap placard, and would like to see if this can be done asap. Pt states that he will be going out of town and would appreciate if he could get this ppw done asap. Form placed in nurse's folder.  Cb#: [phone]

## 2022-01-27 ENCOUNTER — Telehealth: Payer: Self-pay

## 2022-01-27 NOTE — Telephone Encounter (Signed)
Pt called in asking to get a referral to cardiologist [facility] on [address] Please advise. ? ?Cb#: [phone] ?

## 2022-01-28 NOTE — Telephone Encounter (Signed)
Can we call this pt to make an OV to discuss re getting a referral since pt has not bee seen since 5/22.  ? ?Needs to be seen before can send referral.  ? ?Thank you ?

## 2022-02-03 NOTE — Telephone Encounter (Signed)
I have left vm asking pt to return my call so we can schedule an appt ?

## 2022-02-04 NOTE — Telephone Encounter (Signed)
Patient is scheduled for a CPE on Friday, 02/11/22 ?

## 2022-02-11 ENCOUNTER — Other Ambulatory Visit: Payer: Self-pay | Admitting: Family Medicine

## 2022-02-11 ENCOUNTER — Ambulatory Visit (INDEPENDENT_AMBULATORY_CARE_PROVIDER_SITE_OTHER): Payer: 59 | Admitting: Family Medicine

## 2022-02-11 VITALS — BP 158/82 | HR 106 | Temp 98.3°F | Ht 73.0 in | Wt 385.4 lb

## 2022-02-11 DIAGNOSIS — Z0000 Encounter for general adult medical examination without abnormal findings: Secondary | ICD-10-CM | POA: Diagnosis not present

## 2022-02-11 DIAGNOSIS — R0609 Other forms of dyspnea: Secondary | ICD-10-CM | POA: Diagnosis not present

## 2022-02-11 DIAGNOSIS — E119 Type 2 diabetes mellitus without complications: Secondary | ICD-10-CM | POA: Diagnosis not present

## 2022-02-11 DIAGNOSIS — Z860100 Personal history of colon polyps, unspecified: Secondary | ICD-10-CM

## 2022-02-11 DIAGNOSIS — I10 Essential (primary) hypertension: Secondary | ICD-10-CM | POA: Diagnosis not present

## 2022-02-11 DIAGNOSIS — E7800 Pure hypercholesterolemia, unspecified: Secondary | ICD-10-CM

## 2022-02-11 DIAGNOSIS — Z125 Encounter for screening for malignant neoplasm of prostate: Secondary | ICD-10-CM

## 2022-02-11 NOTE — Progress Notes (Signed)
Subjective:    Patient ID: Christopher Spence, male    DOB: [birth year], 61 y.o.   MRN: [id number]  HPI  Patient is a very pleasant 61 year old Caucasian male here today for complete physical exam.   Colonoscopy 2020, recommend repeat in 5 years.   However patient reports increasing dyspnea on exertion.  He states that he wanted to get an EKG because he is never have 1.  He is adamant that he is not having chest pain.  He denies any palpitations.  He denies any cough or pleurisy.  He denies any hemoptysis.  He does not smoke.  He denies any fevers or chills or weight loss.  He has significant swelling in both legs.  He has +1 pitting edema distal to his knees.  I have long been concerned the patient has obstructive sleep apnea but he has declined a sleep study up until now.  Therefore I am concerned about congestive heart failure and cardiomyopathy or even right-sided heart failure due to his body habitus.  However he denies angina.  EKG shows normal sinus rhythm however it does show poor R wave progression in the precordial leads.  There is no evidence of ischemia or infarction Past Medical History:  Diagnosis Date   Allergy    occ- mild   Arthritis    knees   Diabetes ([place])    Hyperlipidemia    Hypertension    Personal history of colonic polyps-adenomas 10/15/2012   Past Surgical History:  Procedure Laterality Date   COLONOSCOPY  10/15/2012   Procedure: COLONOSCOPY;  Surgeon: Gatha Mayer, MD;  Location: [facility];  Service: Endoscopy;  Laterality: N/A;   COLONOSCOPY     KNEE ARTHROSCOPY     bilateral; twice on each knee   KNEE ARTHROTOMY  1976   right   POLYPECTOMY     UMBILICAL HERNIA REPAIR     x 2   Current Outpatient Medications on File Prior to Visit  Medication Sig Dispense Refill   amLODipine (NORVASC) 10 MG tablet TAKE 1 TABLET(10 MG) BY MOUTH DAILY 90 tablet 3   aspirin 81 MG tablet Take 81 mg by mouth daily.     atorvastatin (LIPITOR) 20 MG tablet TAKE 1 TABLET BY  MOUTH  DAILY 90 tablet 3   doxazosin (CARDURA) 4 MG tablet TAKE 1 TABLET(4 MG) BY MOUTH DAILY 90 tablet 3   empagliflozin (JARDIANCE) 25 MG TABS tablet TAKE 1 TABLET(25 MG) BY MOUTH DAILY BEFORE BREAKFAST 90 tablet 3   fish oil-omega-3 fatty acids 1000 MG capsule Take by mouth daily. Takes 2 capsules daily     glipiZIDE (GLUCOTROL) 5 MG tablet TAKE 1 TABLET BY MOUTH  DAILY BEFORE BREAKFAST 90 tablet 3   Green Tea, Camillia sinensis, (GREEN TEA PO) Take by mouth. Takes 2 green tea capsules daily     hydrochlorothiazide (HYDRODIURIL) 12.5 MG tablet TAKE 1 TABLET BY MOUTH  DAILY 90 tablet 3   losartan (COZAAR) 100 MG tablet TAKE 1 TABLET(100 MG) BY MOUTH DAILY 90 tablet 3   metFORMIN (GLUCOPHAGE) 1000 MG tablet Take 1 tablet (1,000 mg total) by mouth 2 (two) times daily with a meal. 180 tablet 3   pioglitazone (ACTOS) 30 MG tablet Take 1 tablet (30 mg total) by mouth daily. 90 tablet 3   No current facility-administered medications on file prior to visit.   No Known Allergies Social History   Socioeconomic History   Marital status: Married    Spouse name: Not on file  Number of children: Not on file   Years of education: Not on file   Highest education level: Not on file  Occupational History   Not on file  Tobacco Use   Smoking status: Never   Smokeless tobacco: Never  Vaping Use   Vaping Use: Never used  Substance and Sexual Activity   Alcohol use: Yes    Alcohol/week: 35.0 standard drinks    Types: 35 Cans of beer per week    Comment: 4-5 times weekly,4 to 5 beers   Drug use: No   Sexual activity: Yes    Comment: married  Other Topics Concern   Not on file  Social History Narrative   Not on file   Social Determinants of Health   Financial Resource Strain: Not on file  Food Insecurity: Not on file  Transportation Needs: Not on file  Physical Activity: Not on file  Stress: Not on file  Social Connections: Not on file  Intimate Partner Violence: Not on file   Family  History  Problem Relation Age of Onset   Cancer Mother        breast   Breast cancer Mother    Cancer Father        not sure primary source    Colon cancer Neg Hx    Stomach cancer Neg Hx    Colon polyps Neg Hx    Esophageal cancer Neg Hx    Rectal cancer Neg Hx       Review of Systems  All other systems reviewed and are negative.     Objective:   Physical Exam Vitals reviewed.  Constitutional:      General: He is not in acute distress.    Appearance: Normal appearance. He is well-developed. He is obese. He is not ill-appearing, toxic-appearing or diaphoretic.  HENT:     Head: Normocephalic and atraumatic.     Right Ear: Tympanic membrane, ear canal and external ear normal.     Left Ear: Tympanic membrane, ear canal and external ear normal.     Nose: Nose normal. No congestion or rhinorrhea.     Mouth/Throat:     Pharynx: No oropharyngeal exudate.  Eyes:     General: No scleral icterus.       Right eye: No discharge.        Left eye: No discharge.     Conjunctiva/sclera: Conjunctivae normal.     Pupils: Pupils are equal, round, and reactive to light.  Neck:     Thyroid: No thyromegaly.     Vascular: No carotid bruit or JVD.     Trachea: No tracheal deviation.  Cardiovascular:     Rate and Rhythm: Normal rate and regular rhythm.     Pulses: Normal pulses.     Heart sounds: Normal heart sounds. No murmur heard.   No friction rub. No gallop.  Pulmonary:     Effort: Pulmonary effort is normal. No respiratory distress.     Breath sounds: Normal breath sounds. No stridor. No wheezing, rhonchi or rales.  Chest:     Chest wall: No tenderness.  Abdominal:     General: Bowel sounds are normal. There is no distension.     Palpations: Abdomen is soft. There is no mass.     Tenderness: There is no abdominal tenderness. There is no guarding or rebound.  Musculoskeletal:     Cervical back: Neck supple. No rigidity or tenderness.     Right lower leg: Edema present.  Left lower leg: Edema present.  Lymphadenopathy:     Cervical: No cervical adenopathy.  Skin:    General: Skin is warm.     Coloration: Skin is not jaundiced or pale.     Findings: No bruising, erythema, lesion or rash.  Neurological:     General: No focal deficit present.     Mental Status: He is alert and oriented to person, place, and time. Mental status is at baseline.     Cranial Nerves: No cranial nerve deficit.     Sensory: No sensory deficit.     Motor: No weakness or abnormal muscle tone.     Coordination: Coordination normal.     Gait: Gait normal.     Deep Tendon Reflexes: Reflexes are normal and symmetric. Reflexes normal.  Psychiatric:        Behavior: Behavior normal.        Thought Content: Thought content normal.        Judgment: Judgment normal.          Assessment & Plan:  Routine general medical examination at a health care facility  Controlled type 2 diabetes mellitus without complication, without long-term current use of insulin ([place]) - Plan: CBC with Differential/Platelet, Lipid panel, Microalbumin, urine, COMPLETE METABOLIC PANEL WITH GFR, Hemoglobin A1c  Pure hypercholesterolemia  Essential hypertension  Prostate cancer screening - Plan: PSA  Personal history of colonic polyps-adenomas  Dyspnea on exertion - Plan: EKG 12-Lead I am most concerned by his dyspnea on exertion.  Given his swelling in his legs and his body habitus concerned about heart failure.  I recommended an echocardiogram.  I will also check a CBC CMP lipid panel and A1c.  Screen for prostate cancer with PSA.  Colonoscopy is up-to-date.  If echocardiogram shows diastolic dysfunction or heart failure, would recommend a sleep study because I feel that obstructive sleep apnea could be contributing to this as well.  I will also check a BNP.  Blood pressure is extremely high today however patient checks his blood pressure every day at home and states that his blood pressure typically  120-130/80.  He attributes the high number today to whitecoat syndrome.

## 2022-02-12 LAB — HEMOGLOBIN A1C
Hgb A1c MFr Bld: 5.9 % of total Hgb — ABNORMAL HIGH (ref <5.7)
Mean Plasma Glucose: 123 mg/dL
eAG (mmol/L): 6.8 mmol/L

## 2022-02-12 LAB — LIPID PANEL
Cholesterol: 146 mg/dL (ref <200)
HDL: 68 mg/dL (ref >=40)
LDL Cholesterol (Calc): 54 mg/dL (calc)
Non-HDL Cholesterol (Calc): 78 mg/dL (calc) (ref <130)
Total CHOL/HDL Ratio: 2.1 (calc) (ref <5.0)
Triglycerides: 163 mg/dL — ABNORMAL HIGH (ref <150)

## 2022-02-12 LAB — COMPLETE METABOLIC PANEL WITH GFR
AG Ratio: 1.7 (calc) (ref 1.0–2.5)
ALT: 36 U/L (ref 9–46)
AST: 24 U/L (ref 10–35)
Albumin: 4.3 g/dL (ref 3.6–5.1)
Alkaline phosphatase (APISO): 63 U/L (ref 35–144)
BUN: 13 mg/dL (ref 7–25)
CO2: 25 mmol/L (ref 20–32)
Calcium: 9.3 mg/dL (ref 8.6–10.3)
Chloride: 104 mmol/L (ref 98–110)
Creat: 0.74 mg/dL (ref 0.70–1.35)
Globulin: 2.6 g/dL (calc) (ref 1.9–3.7)
Glucose, Bld: 121 mg/dL — ABNORMAL HIGH (ref 65–99)
Potassium: 5 mmol/L (ref 3.5–5.3)
Sodium: 143 mmol/L (ref 135–146)
Total Bilirubin: 0.6 mg/dL (ref 0.2–1.2)
Total Protein: 6.9 g/dL (ref 6.1–8.1)
eGFR: 102 mL/min/{1.73_m2} (ref >=60)

## 2022-02-12 LAB — CBC WITH DIFFERENTIAL/PLATELET
Absolute Monocytes: 774 cells/uL (ref 200–950)
Basophils Absolute: 43 cells/uL (ref 0–200)
Basophils Relative: 0.6 %
Eosinophils Absolute: 71 cells/uL (ref 15–500)
Eosinophils Relative: 1 %
HCT: 49.3 % (ref 38.5–50.0)
Hemoglobin: 16.3 g/dL (ref 13.2–17.1)
Lymphs Abs: 1626 cells/uL (ref 850–3900)
MCH: 29.5 pg (ref 27.0–33.0)
MCHC: 33.1 g/dL (ref 32.0–36.0)
MCV: 89.3 fL (ref 80.0–100.0)
MPV: 11.2 fL (ref 7.5–12.5)
Monocytes Relative: 10.9 %
Neutro Abs: 4587 cells/uL (ref 1500–7800)
Neutrophils Relative %: 64.6 %
Platelets: 207 10*3/uL (ref 140–400)
RBC: 5.52 10*6/uL (ref 4.20–5.80)
RDW: 12.7 % (ref 11.0–15.0)
Total Lymphocyte: 22.9 %
WBC: 7.1 10*3/uL (ref 3.8–10.8)

## 2022-02-12 LAB — PSA: PSA: 3.75 ng/mL (ref <=4.00)

## 2022-02-12 LAB — BRAIN NATRIURETIC PEPTIDE: Brain Natriuretic Peptide: 16 pg/mL (ref <100)

## 2022-02-12 LAB — MICROALBUMIN, URINE: Microalb, Ur: 0.5 mg/dL

## 2022-02-14 NOTE — Telephone Encounter (Signed)
Requested Prescriptions  Pending Prescriptions Disp Refills  . amLODipine (NORVASC) 10 MG tablet [Pharmacy Med Name: amLODIPine Besylate 10 MG Oral Tablet] 90 tablet 1    Sig: TAKE 1 TABLET BY MOUTH  DAILY     Cardiovascular: Calcium Channel Blockers 2 Failed - 02/11/2022 10:34 PM      Failed - Last BP in normal range    BP Readings from Last 1 Encounters:  02/11/22 (!) 158/82         Failed - Valid encounter within last 6 months    Recent Outpatient Visits          3 days ago Routine general medical examination at a health care facility   [place], Cammie Mcgee, MD   1 year ago Routine general medical examination at a health care facility   [place], Cammie Mcgee, MD   2 years ago Routine general medical examination at a health care facility   [place], Cammie Mcgee, MD   3 years ago Routine general medical examination at a health care facility   [place], Cammie Mcgee, MD   4 years ago Routine general medical examination at a health care facility   [place], Cammie Mcgee, MD             Passed - Last Heart Rate in normal range    Pulse Readings from Last 1 Encounters:  02/11/22 (!) 106

## 2022-03-09 ENCOUNTER — Ambulatory Visit (HOSPITAL_COMMUNITY): Payer: 59 | Attending: Internal Medicine

## 2022-03-09 DIAGNOSIS — R0609 Other forms of dyspnea: Secondary | ICD-10-CM

## 2022-03-09 LAB — ECHOCARDIOGRAM COMPLETE
Area-P 1/2: 2.46 cm2
S' Lateral: 3.1 cm

## 2022-03-09 MED ORDER — PERFLUTREN LIPID MICROSPHERE
3.0000 mL | INTRAVENOUS | Status: AC | PRN
  Administered 2022-03-09: 3 mL via INTRAVENOUS

## 2022-03-29 ENCOUNTER — Other Ambulatory Visit: Payer: Self-pay | Admitting: Family Medicine

## 2022-04-08 ENCOUNTER — Other Ambulatory Visit: Payer: Self-pay | Admitting: Family Medicine

## 2022-04-08 ENCOUNTER — Ambulatory Visit (INDEPENDENT_AMBULATORY_CARE_PROVIDER_SITE_OTHER): Payer: 59 | Admitting: Family Medicine

## 2022-04-08 DIAGNOSIS — E119 Type 2 diabetes mellitus without complications: Secondary | ICD-10-CM

## 2022-04-08 DIAGNOSIS — R0609 Other forms of dyspnea: Secondary | ICD-10-CM | POA: Diagnosis not present

## 2022-04-08 DIAGNOSIS — I5189 Other ill-defined heart diseases: Secondary | ICD-10-CM | POA: Diagnosis not present

## 2022-04-08 NOTE — Progress Notes (Signed)
Subjective:    Patient ID: Christopher Spence, male    DOB: Sep 05, 1961, 61 y.o.   MRN: [id number]  HPI  At this CPE in May, I was concerned by his DOE and ordered an echo: 1. Left ventricular ejection fraction, by estimation, is 60 to 65%. The  left ventricle has normal function. The left ventricle has no regional  wall motion abnormalities. There is mild left ventricular hypertrophy.  Left ventricular diastolic parameters  are consistent with Grade I diastolic dysfunction (impaired relaxation).   2. Right ventricular systolic function is normal. The right ventricular  size is normal.   3. The mitral valve is grossly normal. No evidence of mitral valve  regurgitation.   4. The aortic valve is tricuspid. Aortic valve regurgitation is not  visualized.   5. Aortic dilatation noted. There is borderline dilatation of the aortic  root, measuring 38 mm.   Patient is here today with his wife to discuss.  I have recommended a sleep study.  I believe his diastolic dysfunction is due to his morbid obesity, longstanding hypertension, and likely undiagnosed sleep apnea.  I explained to the patient that the best [place] to treat his diastolic dysfunction is to treat the underlying causes.  First I recommended substantial weight loss.  Second I would recommend a sleep study to evaluate for what I believe is likely undiagnosed sleep apnea given his body habitus.  Third I would recommend treating his blood pressure.  He ensures me that that the only reason his blood pressure is high is that he is aggravated that he has to come to clinic today to discuss this.  He states that his blood pressure at home is better controlled. Past Medical History:  Diagnosis Date   Allergy    occ- mild   Arthritis    knees   Diabetes ([place])    Hyperlipidemia    Hypertension    Personal history of colonic polyps-adenomas 10/15/2012   Past Surgical History:  Procedure Laterality Date   COLONOSCOPY  10/15/2012   Procedure:  COLONOSCOPY;  Surgeon: Gatha Mayer, MD;  Location: [facility];  Service: Endoscopy;  Laterality: N/A;   COLONOSCOPY     KNEE ARTHROSCOPY     bilateral; twice on each knee   KNEE ARTHROTOMY  1976   right   POLYPECTOMY     UMBILICAL HERNIA REPAIR     x 2   Current Outpatient Medications on File Prior to Visit  Medication Sig Dispense Refill   amLODipine (NORVASC) 10 MG tablet TAKE 1 TABLET BY MOUTH  DAILY 90 tablet 1   aspirin 81 MG tablet Take 81 mg by mouth daily.     atorvastatin (LIPITOR) 20 MG tablet TAKE 1 TABLET BY MOUTH  DAILY 90 tablet 3   doxazosin (CARDURA) 4 MG tablet TAKE 1 TABLET(4 MG) BY MOUTH DAILY 90 tablet 3   fish oil-omega-3 fatty acids 1000 MG capsule Take by mouth daily. Takes 2 capsules daily     glipiZIDE (GLUCOTROL) 5 MG tablet TAKE 1 TABLET BY MOUTH  DAILY BEFORE BREAKFAST 90 tablet 3   Green Tea, Camillia sinensis, (GREEN TEA PO) Take by mouth. Takes 2 green tea capsules daily     hydrochlorothiazide (HYDRODIURIL) 12.5 MG tablet TAKE 1 TABLET BY MOUTH  DAILY 90 tablet 3   JARDIANCE 25 MG TABS tablet TAKE 1 TABLET BY MOUTH  DAILY BEFORE BREAKFAST 90 tablet 3   losartan (COZAAR) 100 MG tablet TAKE 1 TABLET(100 MG) BY MOUTH  DAILY 90 tablet 3   metFORMIN (GLUCOPHAGE) 1000 MG tablet TAKE 1 TABLET BY MOUTH  TWICE DAILY WITH MEALS 180 tablet 3   pioglitazone (ACTOS) 30 MG tablet Take 1 tablet (30 mg total) by mouth daily. 90 tablet 3   No current facility-administered medications on file prior to visit.   No Known Allergies Social History   Socioeconomic History   Marital status: Married    Spouse name: Not on file   Number of children: Not on file   Years of education: Not on file   Highest education level: Not on file  Occupational History   Not on file  Tobacco Use   Smoking status: Never   Smokeless tobacco: Never  Vaping Use   Vaping Use: Never used  Substance and Sexual Activity   Alcohol use: Yes    Alcohol/week: 35.0 standard drinks of  alcohol    Types: 35 Cans of beer per week    Comment: 4-5 times weekly,4 to 5 beers   Drug use: No   Sexual activity: Yes    Comment: married  Other Topics Concern   Not on file  Social History Narrative   Not on file   Social Determinants of Health   Financial Resource Strain: Not on file  Food Insecurity: Not on file  Transportation Needs: Not on file  Physical Activity: Not on file  Stress: Not on file  Social Connections: Not on file  Intimate Partner Violence: Not on file   Family History  Problem Relation Age of Onset   Cancer Mother        breast   Breast cancer Mother    Cancer Father        not sure primary source    Colon cancer Neg Hx    Stomach cancer Neg Hx    Colon polyps Neg Hx    Esophageal cancer Neg Hx    Rectal cancer Neg Hx       Review of Systems  All other systems reviewed and are negative.      Objective:   Physical Exam Vitals reviewed.  Constitutional:      General: He is not in acute distress.    Appearance: Normal appearance. He is well-developed. He is obese. He is not ill-appearing, toxic-appearing or diaphoretic.  HENT:     Head: Normocephalic and atraumatic.     Right Ear: Tympanic membrane, ear canal and external ear normal.     Left Ear: Tympanic membrane, ear canal and external ear normal.     Nose: Nose normal. No congestion or rhinorrhea.     Mouth/Throat:     Pharynx: No oropharyngeal exudate.  Eyes:     General: No scleral icterus.       Right eye: No discharge.        Left eye: No discharge.     Conjunctiva/sclera: Conjunctivae normal.     Pupils: Pupils are equal, round, and reactive to light.  Neck:     Thyroid: No thyromegaly.     Vascular: No carotid bruit or JVD.     Trachea: No tracheal deviation.  Cardiovascular:     Rate and Rhythm: Normal rate and regular rhythm.     Pulses: Normal pulses.     Heart sounds: Normal heart sounds. No murmur heard.    No friction rub. No gallop.  Pulmonary:     Effort:  Pulmonary effort is normal. No respiratory distress.     Breath sounds: Normal breath sounds.  No stridor. No wheezing, rhonchi or rales.  Chest:     Chest wall: No tenderness.  Abdominal:     General: Bowel sounds are normal. There is no distension.     Palpations: Abdomen is soft. There is no mass.     Tenderness: There is no abdominal tenderness. There is no guarding or rebound.  Musculoskeletal:     Cervical back: Neck supple. No rigidity or tenderness.     Right lower leg: Edema present.     Left lower leg: Edema present.  Lymphadenopathy:     Cervical: No cervical adenopathy.  Skin:    General: Skin is warm.     Coloration: Skin is not jaundiced or pale.     Findings: No bruising, erythema, lesion or rash.  Neurological:     General: No focal deficit present.     Mental Status: He is alert and oriented to person, place, and time. Mental status is at baseline.     Cranial Nerves: No cranial nerve deficit.     Sensory: No sensory deficit.     Motor: No weakness or abnormal muscle tone.     Coordination: Coordination normal.     Gait: Gait normal.     Deep Tendon Reflexes: Reflexes are normal and symmetric. Reflexes normal.  Psychiatric:        Behavior: Behavior normal.        Thought Content: Thought content normal.        Judgment: Judgment normal.           Assessment & Plan:  Morbid obesity ([place])  Diastolic dysfunction  Dyspnea on exertion  Controlled type 2 diabetes mellitus without complication, without long-term current use of insulin ([place]) I would like to gather some more information before formulating a treatment plan.  I have recommended a referral to a sleep specialist to have a sleep study as I am very concerned that he likely has undiagnosed obstructive sleep apnea that is contributing to his diastolic dysfunction and his dyspnea.  Patient was able to be convinced to see a sleep specialist.  He is already on Jardiance.  I would focus on weight loss to try  to help reduce his morbid obesity.  I would like to switch the patient to [facility] and away from [facility] however I want to get the results of the sleep study first before making this change.  But I did emphasize to the patient that he needs to change his diet to help lose weight to take the pressure off his heart as I feel that this is likely contributing to his shortness of breath as well.  Lastly I recommended treating his blood pressure however he insists that its under control at home.

## 2022-04-11 ENCOUNTER — Ambulatory Visit: Payer: Self-pay | Admitting: *Deleted

## 2022-04-11 NOTE — Telephone Encounter (Signed)
Pt called in to inquire about his meds being refilled. Pt was recently seen in office 7/14, and for a cpe on 02/11/22 with lab work. Pt states he was asked to make an appt to get these meds refilled. Please advise.  Cb#: [id number]

## 2022-04-11 NOTE — Telephone Encounter (Signed)
Requested medication (s) are due for refill today: Yes  Requested medication (s) are on the active medication list: Yes  Last refill:  04/21/21  Future visit scheduled: No, left message to call and make appointment.  Notes to clinic:  See request.    Requested Prescriptions  Pending Prescriptions Disp Refills   pioglitazone (ACTOS) 30 MG tablet [Pharmacy Med Name: Pioglitazone HCl 30 MG Oral Tablet] 90 tablet 3    Sig: TAKE 1 TABLET BY MOUTH  DAILY     Endocrinology:  Diabetes - Glitazones - pioglitazone Failed - 04/08/2022 11:02 PM      Failed - Valid encounter within last 6 months    Recent Outpatient Visits           1 month ago Routine general medical examination at a health care facility   [place], Cammie Mcgee, MD   1 year ago Routine general medical examination at a health care facility   [place], Cammie Mcgee, MD   2 years ago Routine general medical examination at a health care facility   [place], Cammie Mcgee, MD   3 years ago Routine general medical examination at a health care facility   [place], Cammie Mcgee, MD   4 years ago Routine general medical examination at a health care facility   [place], Cammie Mcgee, MD              Passed - HBA1C is between 0 and 7.9 and within 180 days    Hgb A1c MFr Bld  Date Value Ref Range Status  02/11/2022 5.9 (H) <5.7 % of total Hgb Final    Comment:    For someone without known diabetes, a hemoglobin  A1c value between 5.7% and 6.4% is consistent with prediabetes and should be confirmed with a  follow-up test. . For someone with known diabetes, a value <7% indicates that their diabetes is well controlled. A1c targets should be individualized based on duration of diabetes, age, comorbid conditions, and other considerations. . This assay result is consistent with an increased risk of  diabetes. . Currently, no consensus exists regarding use of hemoglobin A1c for diagnosis of diabetes for children. .           doxazosin (CARDURA) 4 MG tablet [Pharmacy Med Name: Doxazosin Mesylate 4 MG Oral Tablet] 90 tablet 3    Sig: TAKE 1 TABLET BY MOUTH  DAILY     Cardiovascular:  Alpha Blockers Failed - 04/08/2022 11:02 PM      Failed - Last BP in normal range    BP Readings from Last 1 Encounters:  04/08/22 (!) 160/90         Failed - Valid encounter within last 6 months    Recent Outpatient Visits           1 month ago Routine general medical examination at a health care facility   [place], Cammie Mcgee, MD   1 year ago Routine general medical examination at a health care facility   [place], Cammie Mcgee, MD   2 years ago Routine general medical examination at a health care facility   [place], Cammie Mcgee, MD   3 years ago Routine general medical examination at a health care facility   [place], Cammie Mcgee, MD   4 years ago Routine general  medical examination at a health care facility   [place], Cammie Mcgee, MD               losartan (COZAAR) 100 MG tablet [Pharmacy Med Name: Losartan Potassium 100 MG Oral Tablet] 90 tablet 3    Sig: TAKE 1 TABLET BY MOUTH  DAILY     Cardiovascular:  Angiotensin Receptor Blockers Failed - 04/08/2022 11:02 PM      Failed - Last BP in normal range    BP Readings from Last 1 Encounters:  04/08/22 (!) 160/90         Failed - Valid encounter within last 6 months    Recent Outpatient Visits           1 month ago Routine general medical examination at a health care facility   [place], Cammie Mcgee, MD   1 year ago Routine general medical examination at a health care facility   [place], Cammie Mcgee, MD   2 years ago Routine general medical examination  at a health care facility   [place], Cammie Mcgee, MD   3 years ago Routine general medical examination at a health care facility   [place], Cammie Mcgee, MD   4 years ago Routine general medical examination at a health care facility   [place], Cammie Mcgee, MD              Passed - Cr in normal range and within 180 days    Creat  Date Value Ref Range Status  02/11/2022 0.74 0.70 - 1.35 mg/dL Final   Creatinine, Urine  Date Value Ref Range Status  01/07/2019 55 20 - 320 mg/dL Final         Passed - K in normal range and within 180 days    Potassium  Date Value Ref Range Status  02/11/2022 5.0 3.5 - 5.3 mmol/L Final         Passed - Patient is not pregnant

## 2022-05-18 ENCOUNTER — Ambulatory Visit (INDEPENDENT_AMBULATORY_CARE_PROVIDER_SITE_OTHER): Payer: 59 | Admitting: Neurology

## 2022-05-18 ENCOUNTER — Encounter: Payer: Self-pay | Admitting: Neurology

## 2022-05-18 VITALS — BP 170/82 | HR 90 | Ht 73.0 in | Wt 388.2 lb

## 2022-05-18 DIAGNOSIS — R600 Localized edema: Secondary | ICD-10-CM

## 2022-05-18 DIAGNOSIS — R0683 Snoring: Secondary | ICD-10-CM

## 2022-05-18 DIAGNOSIS — Z820 Family history of epilepsy and other diseases of the nervous system: Secondary | ICD-10-CM

## 2022-05-18 DIAGNOSIS — R030 Elevated blood-pressure reading, without diagnosis of hypertension: Secondary | ICD-10-CM

## 2022-05-18 DIAGNOSIS — I5189 Other ill-defined heart diseases: Secondary | ICD-10-CM

## 2022-05-18 DIAGNOSIS — R0681 Apnea, not elsewhere classified: Secondary | ICD-10-CM

## 2022-05-18 DIAGNOSIS — G4730 Sleep apnea, unspecified: Secondary | ICD-10-CM | POA: Diagnosis not present

## 2022-05-18 DIAGNOSIS — R635 Abnormal weight gain: Secondary | ICD-10-CM

## 2022-05-18 DIAGNOSIS — R351 Nocturia: Secondary | ICD-10-CM

## 2022-05-18 NOTE — Progress Notes (Signed)
Subjective:    Patient ID: Christopher Spence is a 61 y.o. male.  HPI    Star Age, MD, PhD [facility] [address] [place], [place] [address]  Dear Dr. Dennard Schaumann,  I saw your patient, Christopher Spence, upon your kind request in my sleep clinic today for initial consultation of his sleep disorder, in particular, concern for underlying obstructive sleep apnea.  The patient is unaccompanied today.  As you know, Christopher Spence is a 61 year old right-handed gentleman with an underlying medical history of arthritis, allergies, diabetes, hypertension, hyperlipidemia, diastolic dysfunction, DOE, and severe obesity with a BMI of over 50, who reports snoring and witnessed apneas, per wife's report. I reviewed your office note from 04/08/2022.  His Epworth sleepiness score is 5 out of 24, fatigue severity score is 56 out of 63.  Of note, his blood pressure is elevated today, he forgot to take his blood pressure medications.  His father had sleep apnea and has a CPAP machine.  Patient reports a bedtime between 11 PM and midnight, he is semiretired, he was a Clinical biochemist and now Doctor, hospital.  Rise time is between 8:30 AM and 9 AM typically.  He has nocturia about 2-3 times per average night.  He denies recurrent morning or nocturnal headaches.  He has had weight gain in the past year in the realm of 15 pounds.  He lives with his wife, he has 1 grown stepson.  He has a TV in his bedroom, it is typically on at night but he tries to turn it off before falling asleep.  They have 1 cat in the household.  He does not drink caffeine daily.  He does drink alcohol daily in the form of beer, 2 to 3/day.  He is a non-smoker.  His Past Medical History Is Significant For: Past Medical History:  Diagnosis Date   Allergy    occ- mild   Arthritis    knees   Diabetes ([place])    Hyperlipidemia    Hypertension    Personal history of colonic polyps-adenomas 10/15/2012    His  Past Surgical History Is Significant For: Past Surgical History:  Procedure Laterality Date   COLONOSCOPY  10/15/2012   Procedure: COLONOSCOPY;  Surgeon: Gatha Mayer, MD;  Location: [facility];  Service: Endoscopy;  Laterality: N/A;   COLONOSCOPY     KNEE ARTHROSCOPY     bilateral; twice on each knee   KNEE ARTHROTOMY  1976   right   POLYPECTOMY     UMBILICAL HERNIA REPAIR     x 2    His Family History Is Significant For: Family History  Problem Relation Age of Onset   Cancer Mother        breast   Breast cancer Mother    Cancer Father        not sure primary source    Colon cancer Neg Hx    Stomach cancer Neg Hx    Colon polyps Neg Hx    Esophageal cancer Neg Hx    Rectal cancer Neg Hx     His Social History Is Significant For: Social History   Socioeconomic History   Marital status: Married    Spouse name: Not on file   Number of children: Not on file   Years of education: Not on file   Highest education level: Not on file  Occupational History   Not on file  Tobacco Use   Smoking status:  Never   Smokeless tobacco: Never  Vaping Use   Vaping Use: Never used  Substance and Sexual Activity   Alcohol use: Yes    Alcohol/week: 12.0 standard drinks of alcohol    Types: 12 Cans of beer per week    Comment: 4-5 times weekly,4 to 5 beers   Drug use: No   Sexual activity: Yes    Comment: married  Other Topics Concern   Not on file  Social History Narrative   Not on file   Social Determinants of Health   Financial Resource Strain: Not on file  Food Insecurity: Not on file  Transportation Needs: Not on file  Physical Activity: Not on file  Stress: Not on file  Social Connections: Not on file    His Allergies Are:  No Known Allergies:   His Current Medications Are:  Outpatient Encounter Medications as of 05/18/2022  Medication Sig   amLODipine (NORVASC) 10 MG tablet TAKE 1 TABLET BY MOUTH  DAILY   aspirin 81 MG tablet Take 81 mg by mouth daily.    atorvastatin (LIPITOR) 20 MG tablet TAKE 1 TABLET BY MOUTH  DAILY   doxazosin (CARDURA) 4 MG tablet TAKE 1 TABLET BY MOUTH  DAILY   fish oil-omega-3 fatty acids 1000 MG capsule Take by mouth daily. Takes 2 capsules daily   glipiZIDE (GLUCOTROL) 5 MG tablet TAKE 1 TABLET BY MOUTH  DAILY BEFORE BREAKFAST   Green Tea, Camillia sinensis, (GREEN TEA PO) Take by mouth. Takes 2 green tea capsules daily   hydrochlorothiazide (HYDRODIURIL) 12.5 MG tablet TAKE 1 TABLET BY MOUTH  DAILY   JARDIANCE 25 MG TABS tablet TAKE 1 TABLET BY MOUTH  DAILY BEFORE BREAKFAST   losartan (COZAAR) 100 MG tablet TAKE 1 TABLET BY MOUTH  DAILY   metFORMIN (GLUCOPHAGE) 1000 MG tablet TAKE 1 TABLET BY MOUTH  TWICE DAILY WITH MEALS   pioglitazone (ACTOS) 30 MG tablet TAKE 1 TABLET BY MOUTH  DAILY   No facility-administered encounter medications on file as of 05/18/2022.  :   Review of Systems:  Out of a complete 14 point review of systems, all are reviewed and negative with the exception of these symptoms as listed below:  Review of Systems  Neurological:        Pt here for slrep consult  Pt snores,fatigue ,hypertension . Pt denies headaches,sleep study,and CPAP machine     ESS:5 FSS:56    Objective:  Neurological Exam  Physical Exam Physical Examination:   Vitals:   05/18/22 1346  BP: (!) 170/82  Pulse: 90    General Examination: The patient is a very pleasant 61 y.o. male in no acute distress. He appears well-developed and well-nourished and well groomed.   HEENT: Normocephalic, atraumatic, pupils are equal, round and reactive to light, conjunctiva red and dry bilaterally, right more than left, extraocular tracking is good without limitation to gaze excursion or nystagmus noted. Hearing is grossly intact. Face is symmetric with normal facial animation. Speech is clear with no dysarthria noted. There is no hypophonia. There is no lip, neck/head, jaw or voice tremor. Neck is supple with full range of passive  and active motion. There are no carotid bruits on auscultation. Oropharynx exam reveals: moderate mouth dryness, adequate dental hygiene and marked airway crowding, due to thicker soft palate, larger uvula, tonsillar size of about 3+, Mallampati class III.  Neck circumference 21 three-quarter inches.  Minimal overbite.  Tongue protrudes centrally and palate elevates symmetrically.  Chest: Clear to auscultation  without wheezing, rhonchi or crackles noted.  Heart: S1+S2+0, regular and normal without murmurs, rubs or gallops noted.   Abdomen: Soft, non-tender and non-distended.  Extremities: There is 2-3+ pitting edema in the distal left lower extremity, 2+ edema in the right distal lower extremity.   Skin: Warm and dry without trophic changes noted.   Musculoskeletal: exam reveals no obvious joint deformities, scar medial right knee, arthroscopic surgery scars left knee.   Neurologically:  Mental status: The patient is awake, alert and oriented in all 4 spheres. His immediate and remote memory, attention, language skills and fund of knowledge are appropriate. There is no evidence of aphasia, agnosia, apraxia or anomia. Speech is clear with normal prosody and enunciation. Thought process is linear. Mood is normal and affect is normal.  Cranial nerves II - XII are as described above under HEENT exam.  Motor exam: Normal bulk, strength and tone is noted. There is no obvious tremor. Fine motor skills and coordination: grossly intact.  Cerebellar testing: No dysmetria or intention tremor. There is no truncal or gait ataxia.  Sensory exam: intact to light touch in the upper and lower extremities.  Gait, station and balance: He stands easily. No veering to one side is noted. No leaning to one side is noted. Posture is age-appropriate and stance is narrow based. Gait shows normal stride length and normal pace. No problems turning are noted.   Assessment and Plan:  In summary, Fumio Vandam Harvill is a very  pleasant 61 y.o.-year old male with an underlying medical history of arthritis, allergies, diabetes, hypertension, hyperlipidemia, diastolic dysfunction, DOE, and severe obesity with a BMI of over 50, whose history and physical exam are concerning for sleep disordered breathing, supporting a current working diagnosis of unspecified sleep apnea, with the main differential diagnoses of obstructive sleep apnea (OSA) versus upper airway resistance syndrome (UARS) versus central sleep apnea (CSA), or mixed sleep apnea. A laboratory attended sleep study is considered gold standard for evaluation of sleep disordered breathing and is recommended at this time and clinically justified.   I had a long chat with the patient about my findings and the diagnosis of sleep apnea, particularly OSA, its prognosis and treatment options. We talked about medical/conservative treatments, surgical interventions and non-pharmacological approaches for symptom control. I explained, in particular, the risks and ramifications of untreated moderate to severe OSA, especially with respect to developing cardiovascular disease down the road, including congestive heart failure (CHF), difficult to treat hypertension, cardiac arrhythmias (particularly A-fib), neurovascular complications including TIA, stroke and dementia. Even type 2 diabetes has, in part, been linked to untreated OSA. Symptoms of untreated OSA may include (but may not be limited to) daytime sleepiness, nocturia (i.e. frequent nighttime urination), memory problems, mood irritability and suboptimally controlled or worsening mood disorder such as depression and/or anxiety, lack of energy, lack of motivation, physical discomfort, as well as recurrent headaches, especially morning or nocturnal headaches. We talked about the importance of maintaining a healthy lifestyle and striving for healthy weight. In addition, we talked about the importance of striving for and maintaining good sleep  hygiene.  I advised him to scale back on his daily beer intake and limit himself to 2 or less per day, avoid drinking alcohol close to bedtime. I recommended the following at this time: sleep study  I outlined the differences between a laboratory attended sleep study which is considered more comprehensive and accurate over the option of a home sleep test (HST); the latter may lead to underestimation  of sleep disordered breathing in some instances and does not help with diagnosing upper airway resistance syndrome and is not accurate enough to diagnose primary central sleep apnea typically. I explained the different sleep test procedures to the patient in detail and also outlined possible surgical and non-surgical treatment options of OSA, including the use of a pressure airway pressure (PAP) device (ie CPAP, AutoPAP/APAP or BiPAP in certain circumstances), a custom-made dental device (aka oral appliance, which would require a referral to a specialist dentist or orthodontist typically, and is generally speaking not considered a good choice for patients with full dentures or edentulous state), upper airway surgical options, such as traditional UPPP (which is not considered a first-line treatment) or the Inspire device (hypoglossal nerve stimulator, which would involve a referral for consultation with an ENT surgeon, after careful selection, following inclusion criteria). I explained the PAP treatment option to the patient in detail, as this is generally considered first-line treatment.  The patient indicated that he would be willing to try PAP therapy, if the need arises. I explained the importance of being compliant with PAP treatment, not only for insurance purposes but primarily to improve patient's symptoms symptoms, and for the patient's long term health benefit, including to reduce His cardiovascular risks longer-term.    We will pick up our discussion about the next steps and treatment options after testing.   We will keep him posted as to the test results by phone call and/or MyChart messaging where possible.  We will plan to follow-up in sleep clinic accordingly as well.  I answered all his questions today and the patient was in agreement.   I encouraged him to call with any interim questions, concerns, problems or updates or email [place] through [place].  Generally speaking, sleep test authorizations may take up to 2 weeks, sometimes less, sometimes longer, the patient is encouraged to get in touch with [place] if they do not hear back from the sleep lab staff directly within the next 2 weeks.  Thank you very much for allowing me to participate in the care of this nice patient. If I can be of any further assistance to you please do not hesitate to call me at [phone].  Sincerely,   Star Age, MD, PhD

## 2022-05-18 NOTE — Patient Instructions (Signed)

## 2022-05-18 NOTE — Addendum Note (Signed)
Addended by: Star Age on: 05/18/2022 02:34 PM   Modules accepted: Orders

## 2022-06-22 ENCOUNTER — Telehealth: Payer: Self-pay | Admitting: Neurology

## 2022-06-22 NOTE — Telephone Encounter (Signed)
[employer] pending uploaded notes  

## 2022-07-11 NOTE — Telephone Encounter (Signed)
NPSG- [employer] Josem Kaufmann: [id number] (exp. 06/22/22 to 09/20/22).  Patient is scheduled at [facility] for 08/31/22 at 9pm.  Mailed packet to the patient.

## 2022-07-26 ENCOUNTER — Other Ambulatory Visit: Payer: Self-pay | Admitting: Family Medicine

## 2022-07-27 NOTE — Telephone Encounter (Signed)
Requested Prescriptions  Pending Prescriptions Disp Refills  . amLODipine (NORVASC) 10 MG tablet [Pharmacy Med Name: amLODIPine Besylate 10 MG Oral Tablet] 90 tablet 3    Sig: TAKE 1 TABLET BY MOUTH DAILY     Cardiovascular: Calcium Channel Blockers 2 Failed - 07/26/2022 11:10 PM      Failed - Last BP in normal range    BP Readings from Last 1 Encounters:  05/18/22 (!) 170/82         Failed - Valid encounter within last 6 months    Recent Outpatient Visits          5 months ago Routine general medical examination at a health care facility   [place], Cammie Mcgee, MD   1 year ago Routine general medical examination at a health care facility   [place], Cammie Mcgee, MD   2 years ago Routine general medical examination at a health care facility   [place], Cammie Mcgee, MD   3 years ago Routine general medical examination at a health care facility   [place], Cammie Mcgee, MD   4 years ago Routine general medical examination at a health care facility   [place], Cammie Mcgee, MD             Passed - Last Heart Rate in normal range    Pulse Readings from Last 1 Encounters:  05/18/22 90

## 2022-08-31 ENCOUNTER — Ambulatory Visit (INDEPENDENT_AMBULATORY_CARE_PROVIDER_SITE_OTHER): Payer: 59 | Admitting: Neurology

## 2022-08-31 DIAGNOSIS — G4733 Obstructive sleep apnea (adult) (pediatric): Secondary | ICD-10-CM | POA: Diagnosis not present

## 2022-08-31 DIAGNOSIS — R030 Elevated blood-pressure reading, without diagnosis of hypertension: Secondary | ICD-10-CM

## 2022-08-31 DIAGNOSIS — G4730 Sleep apnea, unspecified: Secondary | ICD-10-CM

## 2022-08-31 DIAGNOSIS — Z820 Family history of epilepsy and other diseases of the nervous system: Secondary | ICD-10-CM

## 2022-08-31 DIAGNOSIS — G4720 Circadian rhythm sleep disorder, unspecified type: Secondary | ICD-10-CM

## 2022-08-31 DIAGNOSIS — R635 Abnormal weight gain: Secondary | ICD-10-CM

## 2022-08-31 DIAGNOSIS — R351 Nocturia: Secondary | ICD-10-CM

## 2022-08-31 DIAGNOSIS — R0681 Apnea, not elsewhere classified: Secondary | ICD-10-CM

## 2022-08-31 DIAGNOSIS — R0683 Snoring: Secondary | ICD-10-CM

## 2022-08-31 DIAGNOSIS — R600 Localized edema: Secondary | ICD-10-CM

## 2022-08-31 DIAGNOSIS — I5189 Other ill-defined heart diseases: Secondary | ICD-10-CM

## 2022-09-07 NOTE — Procedures (Signed)
Physician Interpretation:     [facility] SPLIT NIGHT INTERPRETATION REPORT   STUDY DATE: 08/31/2022     PATIENT NAME:  Christopher Spence         DATE OF BIRTH:  Jan 04, 1961  PATIENT ID:  [id number]    TYPE OF STUDY:  PSG  READING PHYSICIAN: Star Age, MD, PhD SCORING TECHNICIAN: Gaylyn Cheers  Referred by: Dr. Dennard Schaumann  History:  61 year old right-handed gentleman with an underlying medical history of arthritis, allergies, diabetes, hypertension, hyperlipidemia, diastolic dysfunction, DOE, and severe obesity with a BMI of over 50, who reports snoring and?witnessed apneas, per wife's report. ADDITIONAL INFORMATION:  Height: 73.0 in Weight: 388 lb (BMI 51) Neck Size: 21.8 in  Medications: Norvasc, Aspirin, Lipitor, Cardura, fish oil, Glucotrol, green tea capsules, Hydrodiuril, Jardiance, Cozaar, Glucophage, Actos DESCRIPTION: A sleep technologist was in attendance for the duration of the recording.  Data collection, scoring, video monitoring, and reporting were performed in compliance with the AASM Manual for the Scoring of Sleep and Associated Events; (Hypopnea is scored based on the criteria listed in Section VIII D. 1b in the AASM Manual V2.6 using a 4% oxygen desaturation rule or Hypopnea is scored based on the criteria listed in Section VIII D. 1a in the AASM Manual V2.6 using 3% oxygen desaturation and /or arousal rule).  A physician certified by the American Board of Sleep Medicine reviewed each epoch of the study.  FINDINGS:  Please refer to the attached summary for additional quantitative information.  STUDY DETAILS: Lights off was at 21:50: and lights on 04:53: (421 minutes hours in bed). This study was performed with an initial diagnostic portion followed by positive airway pressure titration.  DIAGNOSTIC ANALYSIS   SLEEP CONTINUITY AND SLEEP ARCHITECTURE:  The diagnostic portion of the study began at 21:50 and ended at 00:13, for a recording time  of 2h 20.49mminutes.  Total sleep time was 50 minutes minutes (0.0% supine;  100.0% lateral;  0.0% prone, 0.0% REM sleep), with a decreased sleep efficiency at 35.6%. Sleep latency was normal at 15.0 minutes. REM sleep latency was decreased at 0.0 minutes.  Arousal index was 94.8 /hr. Of the total sleep time, the percentage of stage N1 sleep was 11.0%, stage N2 sleep was 89.0%, both markedly increased, stage N3 sleep and REM sleep were absent. Wake after sleep onset (WASO) time accounted for 75 minutes with mild to moderate sleep fragmentation noted.   AROUSAL (Baseline): There were 76.0 arousals in total, for an arousal index of 91.2 arousals/hour.  Of these, 67.0 were identified as respiratory-related arousals (80.4 /hr), 0 were PLM-related arousals (0.0 /hr), and 9 were non-specific arousals (10.8 /hr)   RESPIRATORY MONITORING:  Based on CMS criteria (using a 4% oxygen desaturation rule for scoring hypopneas), there were 231 apneas (230 obstructive; 0 central; 1 mixed), and 14 hypopneas.  Apnea index was 105.6. Hypopnea index was 6.0. The apnea-hypopnea index was 111.6/hour overall (0.0 supine; 0.0 REM, 0.0 supine REM). There were 0 respiratory effort-related arousals (RERAs).  The RERA index was 0.0 events/hr. Total respiratory disturbance index (RDI) was 111.6 events/hr. RDI results showed: supine RDI  0.0 /hr; non-supine RDI 111.6 /hr; REM RDI 0.0 /hr, supine REM RDI 0.0 /hr.  Based on AASM criteria (using a 3% oxygen desaturation and /or arousal rule for scoring hypopneas), there were 231 apneas (230 obstructive; 0 central; 1 mixed), and 14 hypopneas.  Apnea index was 105.6. Hypopnea index was 7.2. The apnea-hypopnea index was 112.8 overall (0.0  supine; 0.0 REM, 0.0 supine REM). There were 0 respiratory effort-related arousals (RERAs). Total respiratory disturbance index (RDI) was 112.8 events/hr. RDI results showed: supine RDI 0.0 /hr; non-supine RDI 112.8 /hr; REM RDI 0.0 /hr, supine REM RDI 0.0  /hr.   Respiratory events were associated with oxyhemoglobin desaturations (nadir 77%) from a normal baseline (mean 93%). Total time spent at, or below 88% was 40.8 minutes, or 81.6%  of total sleep time. Snoring was absent. There were 0.0 occurrences of Cheyne Stokes breathing.   LIMB MOVEMENTS: There were 0 periodic limb movements of sleep (0.0/hr), of which 0 (0.0/hr) were associated with an arousal.   OXIMETRY: Total sleep time spent at, or below 88% was 16.3 minutes, or 32.6% of total sleep time.    BODY POSITION: Duration of total sleep and percent of total sleep in their respective position is as follows: supine 00 minutes minutes (0.0%), non-supine 50.0 minutes (100.0%); right 00 minutes minutes (0.0%), left 50 minutes minutes (100.0%), and prone 00 minutes minutes (0.0%). Total supine REM sleep time was 00 minutes minutes (0.0% of total REM sleep).   Analysis of electrocardiogram activity showed the highest heart rate for the baseline portion of the study was 101.0 beats per minute.  The average heart rate during sleep was 79 bpm, while the highest heart rate for the same period was 89 bpm.    TREATMENT ANALYSIS SLEEP CONTINUITY AND SLEEP ARCHITECTURE:  The treatment portion of the study began at 00:13 and ended at 04:53, for a recording time of 4h 40.45mminutes.  Total sleep time was 242 minutes minutes (0.0% supine;  100.0% lateral; 0.0% prone, 18.8% REM sleep), with a normal sleep efficiency at 86.5%. Sleep latency was normal at 7.5 minutes. REM sleep latency was increased at 99.0 minutes.  Arousal index was 28.5 /hr. Of the total sleep time, the percentage of stage N1 sleep was 7.0%, stage N3 sleep was 0.0%, and REM sleep was 18.8%. There were 3 Stage R periods observed during this portion of the study, 10 awakenings (i.e. transitions to Stage W from any sleep stage), and 37.0 total stage transitions. Wake after sleep onset (WASO) time accounted for 30 minutes.   AROUSAL: There were 108.0  arousals in total, for an arousal index of 26.7 arousals/hour.  Of these, 98.0 were identified as respiratory-related arousals (24.2 /hr), 1 were PLM-related arousals (0.2 /hr), and 13 were non-specific arousals (3.2 /hr)  RESPIRATORY MONITORING:    While on PAP therapy, based on CMS criteria, the apnea-hypopnea index was 37.6 overall (0.0 supine; 2.0 REM).   While on PAP therapy, based on AASM criteria, the apnea-hypopnea index was 38.4 overall (0.0 supine; 2.5 REM).   Respiratory events were associated with oxyhemoglobin desaturation (nadir 73.0%) from a mean of 94.0%.  Total time spent at, or below 88% was 42.5 minutes, or 17.5%  of total sleep time.  Snoring was absent:  . There were 0.0 occurrences of Cheyne Stokes breathing.  LIMB MOVEMENTS: There were 12 periodic limb movements of sleep (3.0/hr), of which 1 (0.2/hr) were associated with an arousal.   OXIMETRY: Total sleep time spent at, or below 88% was 41.1 minutes, or 16.9% of total sleep time.    BODY POSITION: Duration of total sleep and percent of total sleep in their respective position is as follows: supine 00 minutes minutes (0.0%), non-supine 242.5 minutes (100.0%); right 77 minutes minutes (32.0%), left 165 minutes minutes (68.0%), and prone 00 minutes minutes (0.0%). Total supine REM sleep time was  00 minutes minutes (0.0% of total REM sleep).   Analysis of electrocardiogram activity showed the highest heart rate for the treatment portion of the study was 102.0 beats per minute.  The average heart rate during sleep was 80 bpm, while the highest heart rate for the same period was 94 bpm.     TITRATION DETAILS (SEE ALSO TABLE AT THE END OF THE REPORT):   The patient qualified for a emergency split study due to a baseline AHI of 111.6/h, O2 nadir 77% with absence of REM sleep prior to PAP therapy.  The patient was shown different interfaces, he was fitted with a Fisher-Paykel fullface mask, Evora.  CPAP was started at a pressure  of 5 cm and titrated to a final pressure of 18 cm.  On the final pressure, he had an AHI reduction to 0/h, total sleep time of 134 minutes with nonsupine REM sleep achieved, O2 nadir of 88%.     EEG: Review of the EEG showed no abnormal electrical discharges and symmetrical bihemispheric findings.     EKG: The EKG revealed normal sinus rhythm (NSR).    AUDIO/VIDEO REVIEW: The audio and video review did not show any abnormal or unusual behaviors, movements, phonations or vocalizations. The patient took 1 bathroom break for the night.  Snoring was mostly in the mild range, improved with PAP therapy.   POST-STUDY QUESTIONNAIRE: Post study, the patient indicated, that sleep was worse than usual.    IMPRESSION:   1. Severe Obstructive sleep apnea (OSA) 2. Dysfunctions associated with sleep stages or arousal from sleep   RECOMMENDATIONS:    1. This patient has severe obstructive sleep apnea and responded well on CPAP therapy. The patient qualified for an emergency split sleep study secondary to severe sleep disordered breathing.  Please note that the absence of REM sleep during the baseline portion of the study likely underestimates his sleep disordered breathing.  His baseline AHI was 111.6/h, O2 nadir 77%.  CPAP of 18 cm resulted in significant reduction of his sleep disordered breathing.  I recommend home CPAP therapy at a pressure of 18 cm via fullface mask with heated humidity, or mask of choice, sized to fit. The patient will be advised to be fully compliant with PAP therapy to improve sleep related symptoms and decrease long term cardiovascular risks. Concomitant weight loss is highly recommended. Please note that untreated obstructive sleep apnea may carry additional perioperative morbidity. Patients with significant obstructive sleep apnea should receive perioperative PAP therapy and the surgeons and particularly the anesthesiologist should be informed of the diagnosis and the severity of the  sleep disordered breathing. 2. This study shows sleep fragmentation and abnormal sleep stage percentages; sleep fragmentation improved with REM sleep achieved on PAP therapy.  These are nonspecific findings and per se do not signify an intrinsic sleep disorder or a cause for the patient's sleep-related symptoms. Causes include (but are not limited to) the first night effect of the sleep study, circadian rhythm disturbances, medication effect or an underlying mood disorder or medical problem.  3. The patient should be cautioned not to drive, work at heights, or operate dangerous or heavy equipment when tired or sleepy. Review and reiteration of good sleep hygiene measures should be pursued with any patient. 4. The patient will be seen in follow-up in the sleep clinic at [facility] for discussion of the test results, symptom and treatment compliance review, further management strategies, etc. The referring provider will be notified of the test results.   I  certify that I have reviewed the entire raw data recording prior to the issuance of this report in accordance with the Standards of Accreditation of the American Academy of Sleep Medicine (AASM). Star Age, MD, PhD Medical Director, [place] [facility]) [place], [place] (Neurology and Sleep)            Technical Report:   [facility] [place]  Name: Jamarien, Rodkey BMI: 51.19 Physician: Star Age, MD  ID: [id number] Height: 73.0 in Technician: Gaylyn Cheers, RPSGT  Sex: Male Weight: 388.0 lb Record: [id number]  Age: 29 [[birth year]] Date: 08/31/2022     Medical & Medication History    Mr. Rajkumar is a 61 year old right-handed gentleman with an underlying medical history of arthritis, allergies, diabetes, hypertension, hyperlipidemia, diastolic dysfunction, DOE, and severe obesity with a BMI of over 50, who reports snoring and witnessed apneas, per  wife's report. His Epworth sleepiness score is 5 out of 24, fatigue severity score is 56 out of 63.  Norvasc, Aspirin, Lipitor, Cardura, fish oil, Glucotrol, green tea capsules, Hydrodiuril, Jardiance, Cozaar, Glucophage, Actos   Sleep Disorder      Comments   Patient arrived for a diagnostic polysomnogram. Procedure explained and all questions answered. Standard paste setup without complications. Patient slept supine, left and right. Very mild snoring was heard. Severe respiratory events observed. After 50 minutes TST, AHI = 112. CPAP was shown to patient at 5cm with a Vitera FFM and an Evora FFM. Patient chose the [facility] full face mask. CPAP was started at 5cm/H2O, with heated humidity, and increased to 18 cm/H2O in an effort to control obstructive events and abolish snoring. No obvious cardiac arrhythmias noted. No significant PLMS observed. Patient had one restroom visit.   Baseline Sleep Stage Information Baseline start time: 09:50:12 PM Baseline end time: 12:13:15 AM   Time Total Supine Side Prone Upright  Recording 2h 20.86m0h 12.09mh 8.41m17m 0.28m 741m0.28m  51mep 0h 50.28m 0h62m28m 0h 67m28m 0h 057m 0h 0.29m  Late46m N1 N2 N3 REM Onset Per. Slp. Eff.  Actual 0h 0.28m 0h 3.41m59m 0.28m 61m0.28m 0741m5.28m 04m7.28m 3574m%   Stg17mr Wake N1 N2 N3 REM  Total 90.5 5.5 44.5 0.0 0.0  Supine 12.0 0.0 0.0 0.0 0.0  Side 78.5 5.5 44.5 0.0 0.0  Prone 0.0 0.0 0.0 0.0 0.0  Upright 0.0 0.0 0.0 0.0 0.0   Stg % Wake N1 N2 N3 REM  Total 64.4 11.0 89.0 0.0 0.0  Supine 8.5 0.0 0.0 0.0 0.0  Side 55.9 11.0 89.0 0.0 0.0  Prone 0.0 0.0 0.0 0.0 0.0  Upright 0.0 0.0 0.0 0.0 0.0    CPAP Sleep Stage Information CPAP start time: 12:13:15 AM CPAP end time: 04:53:47 AM   Time Total Supine Side Prone Upright  Recording (TRT) 4h 40.41m 0h 0.28m 4h 4037m 0h 0.441m0h 0.28m16mleep (541m) 4h 267m 0h 0.28m 4h 2.41m 0h16m28m 0h 28mm   La37mcy N1 64mN3 REM61mset Per. Slp. Eff.  Actual 0h 0.28m 0h 8.41m 0h 0.28m 1h 39.077mh 7.41m87m  15.539m6.45%  31mg Dur 4me N1 N2 51mREM  Total 38.0 17.0 180.0 0.0 45.5  Supine 0.0 0.0 0.0 0.0 0.0  Side 38.0 17.0 180.0 0.0 45.5  Prone 0.0 0.0 0.0 0.0 0.0  Upright 0.0 0.0 0.0 0.0 0.0   Stg % Wake N1 N2 N3 REM  Total 13.5 7.0 74.2 0.0 18.8  Supine 0.0 0.0 0.0 0.0 0.0  Side 13.5 7.0 74.2 0.0 18.8  Prone 0.0 0.0 0.0 0.0 0.0  Upright 0.0 0.0 0.0 0.0 0.0    Baseline Respiratory Information Apnea Summary Sub Supine Side Prone Upright  Total 88 Total 88 0 88 0 0    REM 0 0 0 0 0    NREM 88 0 88 0 0  Obs 87 REM 0 0 0 0 0    NREM 87 0 87 0 0  Mix 1 REM 0 0 0 0 0    NREM 1 0 1 0 0  Cen 0 REM 0 0 0 0 0    NREM 0 0 0 0 0   Rera Summary Sub Supine Side Prone Upright  Total 0 Total 0 0 0 0 0    REM 0 0 0 0 0    NREM 0 0 0 0 0   Hypopnea Summary Sub Supine Side Prone Upright  Total 6 Total 6 0 6 0 0    REM 0 0 0 0 0    NREM 6 0 6 0 0   4% Hypopnea Summary Sub Supine Side Prone Upright  Total (4%) 5 Total 5 0 5 0 0    REM 0 0 0 0 0    NREM 5 0 5 0 0     AHI Total Obs Mix Cen  112.80 Apnea 105.60 104.40 1.20 0.00   Hypopnea 7.20 -- -- --  111.60 Hypopnea (4%) 6.00 -- -- --    Total Supine Side Prone Upright  Position AHI 112.80 0.00 112.80 0.00 0.00  REM AHI 0.00   NREM AHI 112.80   Position RDI 112.80 0.00 112.80 0.00 0.00  REM RDI 0.00   NREM RDI 112.80    4% Hypopnea Total Supine Side Prone Upright  Position AHI (4%) 111.60 0.00 111.60 0.00 0.00  REM AHI (4%) 0.00   NREM AHI (4%) 111.60   Position RDI (4%) 111.60 0.00 111.60 0.00 0.00  REM RDI (4%) 0.00   NREM RDI (4%) 111.60    CPAP Respiratory Information Apnea Summary Sub Supine Side Prone Upright  Total 143 Total 143 0 143 0 0    REM 3 0 3 0 0    NREM 140 0 140 0 0  Obs 143 REM 3 0 3 0 0    NREM 140 0 140 0 0  Mix 0 REM 0 0 0 0 0    NREM 0 0 0 0 0  Cen 0 REM 0 0 0 0 0    NREM 0 0 0 0 0   Rera Summary Sub Supine Side Prone Upright  Total 0 Total 0 0 0 0 0    REM 0 0 0 0 0    NREM 0 0 0 0 0    Hypopnea Summary Sub Supine Side Prone Upright  Total 12 Total 12 0 12 0 0    REM 7 0 7 0 0    NREM 5 0 5 0 0   4% Hypopnea Summary Sub Supine Side Prone Upright  Total (4%) 9 Total 9 0 9 0 0    REM 5 0 5 0 0    NREM 4 0 4 0 0     AHI Total Obs Mix Cen  38.35 Apnea 35.38 35.38 0.00 0.00   Hypopnea 2.97 -- -- --  37.61 Hypopnea (4%) 2.23 -- -- --  Total Supine Side Prone Upright  Position AHI 38.35 0.00 38.35 0.00 0.00  REM AHI 13.19   NREM AHI 44.16   Position RDI 38.35 0.00 38.35 0.00 0.00  REM RDI 13.19   NREM RDI 44.16    4% Hypopnea Total Supine Side Prone Upright  Position AHI (4%) 37.61 0.00 37.61 0.00 0.00  REM AHI (4%) 10.55   NREM AHI (4%) 43.86   Position RDI (4%) 37.61 0.00 37.61 0.00 0.00  REM RDI (4%) 10.55   NREM RDI (4%) 43.86    Desaturation Information (Baseline)  <100% <90% <80% <70% <60% <50% <40%  Supine 4 4 0 0 0 0 0  Side 112 96 3 0 0 0 0  Prone 0 0 0 0 0 0 0  Upright 0 0 0 0 0 0 0  Total 116 100 3 0 0 0 0  Desaturation threshold setting: 3% Minimum desaturation setting: 10 seconds SaO2 nadir: 77% The longest event was a 24 sec obstructive Apneawith a minimum SaO2 of 82%. The lowest SaO2 was 77% associated with a 20 sec obstructive Apnea. Awakening/Arousal Information (Baseline) # of Awakenings 5  Wake after sleep onset 75.94m Wake after persistent sleep 74.56m Arousal Assoc. Arousals Index  Apneas 65 78.0  Hypopneas 2 2.4  Leg Movements 3 3.6  Snore 0.0 0.0  PTT Arousals 0 0.0  Spontaneous 9 10.8  Total 79 94.8   Desaturation Information (CPAP)  <100% <90% <80% <70% <60% <50% <40%  Supine 0 0 0 0 0 0 0  Side 164 131 8 0 0 0 0  Prone 0 0 0 0 0 0 0  Upright 0 0 0 0 0 0 0  Total 164 131 8 0 0 0 0  Desaturation threshold setting: 3% Minimum desaturation setting: 10 seconds SaO2 nadir: 73% The longest event was a 51 sec obstructive Apnea with a minimum SaO2 of 73%. The lowest SaO2 was 73% associated with a 51 sec obstructive  Apnea. Awakening/Arousal Information (CPAP) # of Awakenings 10  Wake after sleep onset 30.58m40make after persistent sleep 27.58m 37mrousal Assoc. Arousals Index  Apneas 94 23.3  Hypopneas 4 1.0  Leg Movements 4 1.0  Snore 0.0 0.0  PTT Arousals 0 0.0  Spontaneous 13 3.2  Total 115 28.5     EKG Rates (Baseline) EKG Avg Max Min  Awake 80 101 71  Asleep 79 89 74  EKG Events: Tachycardia Myoclonus Information (Baseline) PLMS LMs Index  Total LMs during PLMS 0 0.0  LMs w/ Microarousals 0 0.0   LM LMs Index  w/ Microarousal 3 3.6  w/ Awakening 2 2.4  w/ Resp Event 0 0.0  Spontaneous 4 4.8  Total 7 8.4   EKG Rates (CPAP) EKG Avg Max Min  Awake 83 102 76  Asleep 80 94 68  EKG Events: Tachycardia Myoclonus Information (CPAP) PLMS LMs Index  Total LMs during PLMS 12 3.0  LMs w/ Microarousals 1 0.2   LM LMs Index  w/ Microarousal 3 0.7  w/ Awakening 2 0.5  w/ Resp Event 0 0.0  Spontaneous 4 1.0  Total 7 1.7      Titration Table:  [facility] CPAP[facility] Report    General Information  Name: Craw,Leomar, [place]: 49 P77sician: ,   ID: [id number]: 73 i69Technician: RandGaylyn Cheersx: Male Weight: 388 lb Record: [id number]  Age: 54 [[birth year]te: 08/31/2022 Scorer: MattGaylyn Cheers  Recommended Settings IPAP: N/A cmH20 EPAP: N/A cmH2O AHI: N/A AHI (4%): N/A   Pressure IPAP/EPAP 00 05 07 09 '11 13 15 17   '$ O2 Vol 0.0 0.0 0.0 0.0 0.0 0.0 0.0 0.0  Time TRT 140.37m20.5338m6.3629m486m.877m 1086m3629m 162mm 9.29m17.27m  TS938m0.3629m 9.877m 629m877m 286m877m 21629mm 136629m 9.5438m7.877m238mlee68mtage638mWake 64.3 53.7 1.9 51.1 15.4 0.0 0.0 0.0   % REM 0.0 0.0 0.0 0.0 0.0 15.4 100.0 100.0   % N1 11.0 57.9 2.0 34.8 2.3 0.0 0.0 0.0   % N2 89.0 42.1 98.0 65.2 97.7 84.6 0.0 0.0   % N3 0.0 0.0 0.0 0.0 0.0 0.0 0.0 0.0  Respiratory Total Events 94 7 55 23 43 '18 8 1   '$ Obs. Apn. 87 6 55 22 42 16 2 0   Mixed Apn. 1 0 0 0 0 0 0 0   Cen. Apn. 0 0 0 0 0 0 0 0    Hypopneas 6 1 0 '1 1 2 6 1   '$ AHI 112.80 44.21 129.41 120.00 117.27 83.08 50.53 3.43   Supine AHI 0.00 0.00 0.00 0.00 0.00 0.00 0.00 0.00   Prone AHI 0.00 0.00 0.00 0.00 0.00 0.00 0.00 0.00   Side AHI 112.80 44.21 129.41 120.00 117.27 83.08 50.53 3.43  Respiratory (4%) Hypopneas (4%) 5.00 1.00 0.00 0.00 1.00 2.00 5.00 0.00   AHI (4%) 111.60 44.21 129.41 114.78 117.27 83.08 44.21 0.00   Supine AHI (4%) 0.00 0.00 0.00 0.00 0.00 0.00 0.00 0.00   Prone AHI (4%) 0.00 0.00 0.00 0.00 0.00 0.00 0.00 0.00   Side AHI (4%) 111.60 44.21 129.41 114.78 117.27 83.08 44.21 0.00  Desat Profile <= 90% 69.86m 4.629m 12.629m 4.786m9.2729m.877m 1086mm 183629mm  329m 806629m1.158m.3629m 438mm 0.3629m 0.3629m 03629m 3.786m0.232m <=877m% 253629mm 027m 0.22m0.01877m.3629m 0.3629m 0.3629m429m3629m 3877m= 622m20.67m0.057m.3629m129m3629m 93629mm 0586m 0.3629m 0.3629m  A53629msal729mdex786mnea83629m.0 57m 9822m73.22m8.233629m.0 0.0 0.0   Hypopnea 2.4 0.0 0.0 0.0 2.7 9.2 6.3 0.0   LM 3.6 0.0 0.0 0.0 0.0 0.0 0.0 0.0   Spontaneous 10.8 12.6 4.7 5.2 5.5 9.2 0.0 0.0   Pressure IPAP/EPAP 18   O2 Vol 0.0  Time TRT 145.3629m   TST 134.3629m  Sleep Stage % Wake 7.6   % REM 5629m3   % N1 4.48m % N2 82.8   % N3 0.0  Respiratory Total Events 0   Obs. Apn. 0   Mixed Apn. 0   Cen. Apn. 0   Hypopneas 0   AHI 0.00   Supine AHI 0.00   Prone AHI 0.00   Side AHI 0.00  Respiratory (4%) Hypopneas (4%) 0.00   AHI (4%) 0.00   Supine AHI (4%) 0.00   Prone AHI (4%) 0.00   Side AHI (4%) 0.00  Desat Profile <= 90% 5.29m   <= 80% 0.38m   <= 70% 0.38m   <= 60% 0.38m  Arou686m Index Apnea52m0   Hypopnea7629m0   LM 1.8  5629montaneous 1.8

## 2022-09-08 ENCOUNTER — Encounter: Payer: Self-pay | Admitting: *Deleted

## 2022-09-08 ENCOUNTER — Telehealth: Payer: Self-pay | Admitting: *Deleted

## 2022-09-08 DIAGNOSIS — G4733 Obstructive sleep apnea (adult) (pediatric): Secondary | ICD-10-CM

## 2022-09-08 NOTE — Telephone Encounter (Signed)
[employer] confirmed receipt of order.  

## 2022-09-08 NOTE — Telephone Encounter (Signed)
-----   Message from Star Age, MD sent at 09/08/2022  8:47 AM EST ----- Urgent set up requested on PAP therapy, due to severe OSA.   Patient referred by Dr. Dennard Schaumann, seen by me on 05/18/2022, patient had a split night sleep study on 08/31/2022. Please call and notify patient that the recent sleep study showed severe obstructive sleep apnea (OSA). He did well with CPAP during the study with significant improvement of the respiratory events. I would like start the patient on a new CPAP machine for home use. I placed the order in the chart.  Please advise patient that we will need a follow up appointment with either myself or one of our nurse practitioners in about 2-3 months post set-up to check for how they are doing on treatment and how well it's going with the machine in general. Most insurance company require a certain compliance percentage to continue to cover/pay for the machine. Please ask patient to schedule this FU appointment, according to the set-up date, which is the day they receive the machine. Please make sure, the patient understands the importance of keeping this window for the FU appointment, as it is often an Designer, industrial/product and not our rule. Failing to adhere to this may result in losing coverage for sleep apnea treatment, at which point some insurances require repeating the whole process. Plus, monitoring compliance data is usually good feedback for the patient as far as how they are doing, how many hours they are on it, how well the mask fits, etc.  Also remind patient, that any PAP machine or mask issues should be first addressed with the DME company, who provided the machine/supplies.  Please ask if patient has a preference regarding DME company, may depend on the insurance too.  Star Age, MD, PhD [facility])

## 2022-09-08 NOTE — Telephone Encounter (Signed)
Brad w/ [employer] requested clarification on pressure setting for CPAP. It is to be 18. Order updated and sent to Dr Rexene Alberts for co-sign.

## 2022-09-08 NOTE — Telephone Encounter (Signed)
Spoke with the patient and discussed his sleep study results.  Patient understands he has OSA and he did very well on CPAP during the night sleep study.  His questions were answered.  He is amenable to starting CPAP at home.  He did not have a preference of DME company.  He will be on the look out for a phone call from [facility] to schedule a set up appointment and will BE for a call from our office to schedule the initial follow-up appointment that is required by insurance and must occur between 30 and 90 days after set up.  The patient also understands the insurance compliance requirement of using the machine at least 4 hours at night, however our goal is for him to use the machine every night and all night long.  He will obtain ongoing supplies and any technical support from [facility].  Patient was very appreciative of the call.   Urgent order sent to [facility]. Sleep study result sent to Dr Dennard Schaumann.

## 2022-09-08 NOTE — Addendum Note (Signed)
Addended by: Star Age on: 09/08/2022 08:47 AM   Modules accepted: Orders

## 2022-09-08 NOTE — Addendum Note (Signed)
Addended by: Gildardo Griffes on: 09/08/2022 05:19 PM   Modules accepted: Orders

## 2022-09-08 NOTE — Telephone Encounter (Signed)
Called pt. Scheduled him for initial CPAP appointment on 3/11 @ 10:45 am. Informed pt to bring machine and power cord.

## 2022-10-04 ENCOUNTER — Other Ambulatory Visit: Payer: Self-pay | Admitting: Family Medicine

## 2022-10-05 NOTE — Telephone Encounter (Signed)
Requested Prescriptions  Pending Prescriptions Disp Refills   amLODipine (NORVASC) 10 MG tablet [Pharmacy Med Name: amLODIPine Besylate 10 MG Oral Tablet] 90 tablet 0    Sig: TAKE 1 TABLET BY MOUTH DAILY     Cardiovascular: Calcium Channel Blockers 2 Failed - 10/04/2022 11:04 PM      Failed - Last BP in normal range    BP Readings from Last 1 Encounters:  05/18/22 (!) 170/82         Failed - Valid encounter within last 6 months    Recent Outpatient Visits           7 months ago Routine general medical examination at a health care facility   [place], Cammie Mcgee, MD   1 year ago Routine general medical examination at a health care facility   [place], Cammie Mcgee, MD   2 years ago Routine general medical examination at a health care facility   [place], Cammie Mcgee, MD   4 years ago Routine general medical examination at a health care facility   [place], Cammie Mcgee, MD   5 years ago Routine general medical examination at a health care facility   [place], Cammie Mcgee, MD              Passed - Last Heart Rate in normal range    Pulse Readings from Last 1 Encounters:  05/18/22 90

## 2022-12-05 ENCOUNTER — Ambulatory Visit (INDEPENDENT_AMBULATORY_CARE_PROVIDER_SITE_OTHER): Payer: 59 | Admitting: Neurology

## 2022-12-05 ENCOUNTER — Telehealth: Payer: Self-pay | Admitting: *Deleted

## 2022-12-05 ENCOUNTER — Encounter: Payer: Self-pay | Admitting: Neurology

## 2022-12-05 VITALS — BP 154/85 | HR 86 | Ht 73.0 in | Wt >= 6400 oz

## 2022-12-05 DIAGNOSIS — G4733 Obstructive sleep apnea (adult) (pediatric): Secondary | ICD-10-CM | POA: Diagnosis not present

## 2022-12-05 DIAGNOSIS — Z789 Other specified health status: Secondary | ICD-10-CM | POA: Diagnosis not present

## 2022-12-05 NOTE — Telephone Encounter (Signed)
Changed pt's CPAP pressure to 16 cm H20 in [facility] portal. Order sent to [facility] for their records.

## 2022-12-05 NOTE — Progress Notes (Signed)
Subjective:    Patient ID: Christopher Spence is a 62 y.o. male.  HPI    Interim history:   Christopher Spence is a 62 year old right-handed gentleman with an underlying medical history of arthritis, allergies, diabetes, hypertension, hyperlipidemia, diastolic dysfunction, DOE, lower extremity edema, and severe obesity with a BMI of over 50, who presents for follow-up consultation of his obstructive sleep apnea after interim testing and starting home CPAP therapy.  The patient is unaccompanied today.  I first met him at the request of his primary care physician on 05/18/2022, at which time he reported snoring and witnessed apneas.  He was advised to proceed with a sleep study.  He had a split-night sleep study through our sleep lab on 08/31/2022.  The patient qualified for an emergency split study due to baseline AHI of 111.6/h, O2 nadir 77%.  He did not achieve any REM sleep or supine sleep during the diagnostic portion of the study.  He was titrated from CPAP of 5 cm to 18 cm via fullface mask from [employer]; on the final treatment pressure of 18 cm his AHI was 0/h, with nonsupine REM sleep achieved, O2 nadir 88%.  He was advised to proceed with home CPAP treatment.  His set up date was 09/16/2022.  He has a ResMed air sense 11 AutoSet machine.  His DME company is [employer].  Today, 12/05/2022: I reviewed his CPAP compliance data from 11/01/2022 through 11/30/2022, which is a total of 30 days, during which time he used his machine every night with percent use days greater than 4 hours at 100%, indicating superb compliance, average usage of 6 hours and 48 minutes, residual AHI at goal at 0/h, leak high fairly consistently with the 95th percentile at 62 L/min on a pressure of 18 cm with EPR of 3.  He reports using his CPAP consistently.  He has improvement in his sleep consolidation, is less restless at night, has less nocturia.  He does not even have to get up every night to go to the bathroom, and used to be 2-3 times per  night.  His Epworth sleepiness score is 3 out of 24, also improved.  He has significant lower extremity edema.  He does not have a follow-up appointment currently with cardiology or primary care.  He has shortness of breath with even minimal exertion he admits.  He continues to take his medications regularly including hydrochlorothiazide 12.5 mg daily.  He reports that he does not use the humidifier in his CPAP machine and admits waking up with mouth dryness.  He has quite a bit of conjunctival injection today.  He also reports shortness of breath with minimal exertion.  He does not currently have any shortness of breath.  The patient's allergies, current medications, family history, past medical history, past social history, past surgical history and problem list were reviewed and updated as appropriate.   Previously:   823/23: (He) reports snoring and witnessed apneas, per wife's report. I reviewed your office note from 04/08/2022.  His Epworth sleepiness score is 5 out of 24, fatigue severity score is 56 out of 63.  Of note, his blood pressure is elevated today, he forgot to take his blood pressure medications.  His father had sleep apnea and has a CPAP machine.  Patient reports a bedtime between 11 PM and midnight, he is semiretired, he was a Clinical biochemist and now Doctor, hospital.  Rise time is between 8:30 AM and 9 AM typically.  He has nocturia about  2-3 times per average night.  He denies recurrent morning or nocturnal headaches.  He has had weight gain in the past year in the realm of 15 pounds.  He lives with his wife, he has 1 grown stepson.  He has a TV in his bedroom, it is typically on at night but he tries to turn it off before falling asleep.  They have 1 cat in the household.  He does not drink caffeine daily.  He does drink alcohol daily in the form of beer, 2 to 3/day.  He is a non-smoker.     His Past Medical History Is Significant For: Past Medical History:  Diagnosis  Date   Allergy    occ- mild   Arthritis    knees   Diabetes ([place])    Hyperlipidemia    Hypertension    Personal history of colonic polyps-adenomas 10/15/2012    His Past Surgical History Is Significant For: Past Surgical History:  Procedure Laterality Date   COLONOSCOPY  10/15/2012   Procedure: COLONOSCOPY;  Surgeon: Gatha Mayer, MD;  Location: [facility];  Service: Endoscopy;  Laterality: N/A;   COLONOSCOPY     KNEE ARTHROSCOPY     bilateral; twice on each knee   KNEE ARTHROTOMY  1976   right   POLYPECTOMY     UMBILICAL HERNIA REPAIR     x 2    His Family History Is Significant For: Family History  Problem Relation Age of Onset   Cancer Mother        breast   Breast cancer Mother    Cancer Father        not sure primary source    Sleep apnea Father    Colon cancer Neg Hx    Stomach cancer Neg Hx    Colon polyps Neg Hx    Esophageal cancer Neg Hx    Rectal cancer Neg Hx     His Social History Is Significant For: Social History   Socioeconomic History   Marital status: Married    Spouse name: Not on file   Number of children: Not on file   Years of education: Not on file   Highest education level: Not on file  Occupational History   Not on file  Tobacco Use   Smoking status: Never   Smokeless tobacco: Never  Vaping Use   Vaping Use: Never used  Substance and Sexual Activity   Alcohol use: Not Currently    Alcohol/week: 30.0 standard drinks of alcohol    Types: 30 Cans of beer per week   Drug use: No   Sexual activity: Yes    Comment: married  Other Topics Concern   Not on file  Social History Narrative   Not on file   Social Determinants of Health   Financial Resource Strain: Not on file  Food Insecurity: Not on file  Transportation Needs: Not on file  Physical Activity: Not on file  Stress: Not on file  Social Connections: Not on file    His Allergies Are:  No Known Allergies:   His Current Medications Are:  Outpatient Encounter  Medications as of 12/05/2022  Medication Sig   amLODipine (NORVASC) 10 MG tablet TAKE 1 TABLET BY MOUTH DAILY   aspirin 81 MG tablet Take 81 mg by mouth daily.   atorvastatin (LIPITOR) 20 MG tablet TAKE 1 TABLET BY MOUTH  DAILY   doxazosin (CARDURA) 4 MG tablet TAKE 1 TABLET BY MOUTH  DAILY   fish  oil-omega-3 fatty acids 1000 MG capsule Take by mouth daily. Takes 2 capsules daily   glipiZIDE (GLUCOTROL) 5 MG tablet TAKE 1 TABLET BY MOUTH  DAILY BEFORE BREAKFAST   Green Tea, Camillia sinensis, (GREEN TEA PO) Take by mouth. Takes 2 green tea capsules daily   hydrochlorothiazide (HYDRODIURIL) 12.5 MG tablet TAKE 1 TABLET BY MOUTH  DAILY   JARDIANCE 25 MG TABS tablet TAKE 1 TABLET BY MOUTH  DAILY BEFORE BREAKFAST   losartan (COZAAR) 100 MG tablet TAKE 1 TABLET BY MOUTH  DAILY   metFORMIN (GLUCOPHAGE) 1000 MG tablet TAKE 1 TABLET BY MOUTH  TWICE DAILY WITH MEALS   pioglitazone (ACTOS) 30 MG tablet TAKE 1 TABLET BY MOUTH  DAILY   No facility-administered encounter medications on file as of 12/05/2022.  :  Review of Systems:  Out of a complete 14 point review of systems, all are reviewed and negative with the exception of these symptoms as listed below:  Review of Systems  Neurological:        Pt here for CPAP f/u Pt states still fatigue Pt states some SOB Pt states increased air pressure during and night and wakes him up  Pt states some mouth dryness    ESS:3 FSS:45     Objective:  Neurological Exam  Physical Exam Physical Examination:   Vitals:   12/05/22 1050  BP: (!) 154/85  Pulse: 86    General Examination: The patient is a very pleasant 61 y.o. male in no acute distress. He appears well-developed and well-nourished and well groomed.   HEENT: Normocephalic, atraumatic, pupils are equal, round and reactive to light, conjunctiva red and dry bilaterally.  Extraocular tracking well-preserved.  No obvious nystagmus noted. Hearing is grossly intact. Face is symmetric with normal  facial animation. Speech is clear with no dysarthria noted. There is no hypophonia. There is no lip, neck/head, jaw or voice tremor. Neck is supple with full range of passive and active motion. There are no carotid bruits on auscultation. Oropharynx exam reveals: Mild to moderate mouth dryness, adequate dental hygiene and marked airway crowding.  Tongue protrudes centrally and palate elevates symmetrically.   Chest: Clear to auscultation without wheezing, rhonchi or crackles noted.   Heart: S1+S2+0, regular and normal without murmurs, rubs or gallops noted.    Abdomen: Soft, non-tender and non-distended.   Extremities: There is 2-3+ pitting edema in the distal left lower extremity, 2+ edema in the right distal lower extremity.    Skin: Warm and dry without trophic changes noted.    Musculoskeletal: exam reveals no obvious joint deformities, scar medial right knee, arthroscopic surgery scars left knee.    Neurologically:  Mental status: The patient is awake, alert and oriented in all 4 spheres. His immediate and remote memory, attention, language skills and fund of knowledge are appropriate. There is no evidence of aphasia, agnosia, apraxia or anomia. Speech is clear with normal prosody and enunciation. Thought process is linear. Mood is normal and affect is normal.  Cranial nerves II - XII are as described above under HEENT exam.  Motor exam: Normal bulk, strength and tone is noted. There is no obvious tremor. Fine motor skills and coordination: grossly intact.  Cerebellar testing: No dysmetria or intention tremor. There is no truncal or gait ataxia.  Sensory exam: intact to light touch in the upper and lower extremities.  Gait, station and balance: He stands easily. No veering to one side is noted. No leaning to one side is noted. Posture is age-appropriate  and stance is narrow based. Gait shows normal stride length and normal pace. No problems turning are noted.    Assessment and Plan:  In  summary, Christopher Spence is a very pleasant 62 year old male with an underlying medical history of arthritis, allergies, diabetes, hypertension, hyperlipidemia, diastolic dysfunction, DOE, lower extremity edema, and severe obesity with a BMI of over 50, who presents for follow-up consultation of his obstructive sleep apnea after interim testing and starting home CPAP therapy.  He had a split-night sleep study on 08/31/2022, which indicated rather severe obstructive sleep apnea with an AHI of 111.6/h, and O2 nadir 77% during the baseline portion of the study, of note, he did not achieve any REM sleep or supine sleep during the diagnostic portion of the study.  He was titrated from CPAP of 5 cm to 18 cm.  He started home CPAP therapy on 09/16/2022.  He has a ResMed air sense 11 AutoSet machine.  His DME company is [employer]. He is compliant with treatment, apnea scores at goal.  His leak is consistently very high.  He uses an under the nose style fullface mask and is currently not using his humidifier in the machine.  He is encouraged to use the humidity as his pressure is very high, he has conjunctival irritation and mouth dryness.  He is struggling with air leakage.  I suggested we reduce his pressure to 16 cm at this time.  He is advised to call our office in a couple of months to see if his download still looks good as far as apnea control and if his leak has become better.  He is advised to follow-up with his primary care regarding significant lower extremity edema.  It is more pronounced on the left.  He is commended for his treatment adherence.  He is advised to follow-up routinely in this clinic to see one of our nurse practitioners in 6 months, sooner if needed.  If he is stable at the time, we can see him yearly after that.  I answered all his questions today and he was in agreement. I spent 30 minutes in total face-to-face time and in reviewing records during pre-charting, more than 50% of which was spent in  counseling and coordination of care, reviewing test results, reviewing medications and treatment regimen and/or in discussing or reviewing the diagnosis of OSA, the prognosis and treatment options. Pertinent laboratory and imaging test results that were available during this visit with the patient were reviewed by me and considered in my medical decision making (see chart for details).

## 2022-12-05 NOTE — Patient Instructions (Signed)
We can see you in 6 months, you can see one of our nurse practitioners.   Please make an appointment with your PCP about leg swelling and shortness of breath.   We will reduce your pressure on your CPAP machine to 16 cm from 18 cm.   Call in 2 months, so we can review a download/report off your machine.   Please continue using your CPAP regularly. While your insurance requires that you use CPAP at least 4 hours each night on 70% of the nights, I recommend, that you not skip any nights and use it throughout the night if you can. Getting used to CPAP and staying with the treatment long term does take time and patience and discipline. Untreated obstructive sleep apnea when it is moderate to severe can have an adverse impact on cardiovascular health and raise her risk for heart disease, arrhythmias, hypertension, congestive heart failure, stroke and diabetes. Untreated obstructive sleep apnea causes sleep disruption, nonrestorative sleep, and sleep deprivation. This can have an impact on your day to day functioning and cause daytime sleepiness and impairment of cognitive function, memory loss, mood disturbance, and problems focussing. Using CPAP regularly can improve these symptoms.

## 2022-12-09 ENCOUNTER — Encounter: Payer: Self-pay | Admitting: Family Medicine

## 2022-12-09 ENCOUNTER — Telehealth: Payer: Self-pay | Admitting: Family Medicine

## 2022-12-09 ENCOUNTER — Ambulatory Visit (INDEPENDENT_AMBULATORY_CARE_PROVIDER_SITE_OTHER): Payer: 59 | Admitting: Family Medicine

## 2022-12-09 ENCOUNTER — Other Ambulatory Visit: Payer: Self-pay | Admitting: Family Medicine

## 2022-12-09 DIAGNOSIS — I5189 Other ill-defined heart diseases: Secondary | ICD-10-CM | POA: Diagnosis not present

## 2022-12-09 DIAGNOSIS — E119 Type 2 diabetes mellitus without complications: Secondary | ICD-10-CM

## 2022-12-09 MED ORDER — TIRZEPATIDE 5 MG/0.5ML ~~LOC~~ SOAJ: 5.0000 mg | SUBCUTANEOUS | 2 refills | Status: DC

## 2022-12-09 NOTE — Telephone Encounter (Signed)
Patient called to follow up on today's visit. Patient called back with some information requested by provider and is requesting a call back from the nurse today if possible.  Please advise at [phone]

## 2022-12-09 NOTE — Progress Notes (Signed)
Subjective:    Patient ID: Christopher Spence, male    DOB: 1960/10/04, 62 y.o.   MRN: [id number]  HPI  04/08/22 At this CPE in May, I was concerned by his DOE and ordered an echo: 1. Left ventricular ejection fraction, by estimation, is 60 to 65%. The  left ventricle has normal function. The left ventricle has no regional  wall motion abnormalities. There is mild left ventricular hypertrophy.  Left ventricular diastolic parameters  are consistent with Grade I diastolic dysfunction (impaired relaxation).   2. Right ventricular systolic function is normal. The right ventricular  size is normal.   3. The mitral valve is grossly normal. No evidence of mitral valve  regurgitation.   4. The aortic valve is tricuspid. Aortic valve regurgitation is not  visualized.   5. Aortic dilatation noted. There is borderline dilatation of the aortic  root, measuring 38 mm.   Patient is here today with his wife to discuss.  I have recommended a sleep study.  I believe his diastolic dysfunction is due to his morbid obesity, longstanding hypertension, and likely undiagnosed sleep apnea.  I explained to the patient that the best [place] to treat his diastolic dysfunction is to treat the underlying causes.  First I recommended substantial weight loss.  Second I would recommend a sleep study to evaluate for what I believe is likely undiagnosed sleep apnea given his body habitus.  Third I would recommend treating his blood pressure.  He ensures me that that the only reason his blood pressure is high is that he is aggravated that he has to come to clinic today to discuss this.  He states that his blood pressure at home is better controlled.  At that time, my plan was: I would like to gather some more information before formulating a treatment plan.  I have recommended a referral to a sleep specialist to have a sleep study as I am very concerned that he likely has undiagnosed obstructive sleep apnea that is contributing to his  diastolic dysfunction and his dyspnea.  Patient was able to be convinced to see a sleep specialist.  He is already on Jardiance.  I would focus on weight loss to try to help reduce his morbid obesity.  I would like to switch the patient to [facility] and away from Actos however I want to get the results of the sleep study first before making this change.  But I did emphasize to the patient that he needs to change his diet to help lose weight to take the pressure off his heart as I feel that this is likely contributing to his shortness of breath as well.  Lastly I recommended treating his blood pressure however he insists that its under control at home.  12/09/22 Patient had OSA with AHI of 11 with O2 of 77&.  Currently on CPAP.  Patient states that his breathing may be slightly better.  He is definitely sleeping better.  However he continues to get winded with minimal activity.  He also has +1 to +2 edema in his legs.  Of note he is on pioglitazone.  His most recent A1c was 5.9.  He also has diastolic dysfunction.  He is currently on hydrochlorothiazide. Past Medical History:  Diagnosis Date   Allergy    occ- mild   Arthritis    knees   Diabetes ([place])    Hyperlipidemia    Hypertension    Personal history of colonic polyps-adenomas 10/15/2012   Past Surgical  History:  Procedure Laterality Date   COLONOSCOPY  10/15/2012   Procedure: COLONOSCOPY;  Surgeon: Christopher Mayer, MD;  Location: [facility];  Service: Endoscopy;  Laterality: N/A;   COLONOSCOPY     KNEE ARTHROSCOPY     bilateral; twice on each knee   KNEE ARTHROTOMY  1976   right   POLYPECTOMY     UMBILICAL HERNIA REPAIR     x 2   Current Outpatient Medications on File Prior to Visit  Medication Sig Dispense Refill   amLODipine (NORVASC) 10 MG tablet TAKE 1 TABLET BY MOUTH DAILY 90 tablet 0   aspirin 81 MG tablet Take 81 mg by mouth daily.     atorvastatin (LIPITOR) 20 MG tablet TAKE 1 TABLET BY MOUTH  DAILY 90 tablet 3   doxazosin  (CARDURA) 4 MG tablet TAKE 1 TABLET BY MOUTH  DAILY 90 tablet 3   fish oil-omega-3 fatty acids 1000 MG capsule Take by mouth daily. Takes 2 capsules daily     glipiZIDE (GLUCOTROL) 5 MG tablet TAKE 1 TABLET BY MOUTH  DAILY BEFORE BREAKFAST 90 tablet 3   Green Tea, Camillia sinensis, (GREEN TEA PO) Take by mouth. Takes 2 green tea capsules daily     hydrochlorothiazide (HYDRODIURIL) 12.5 MG tablet TAKE 1 TABLET BY MOUTH  DAILY 90 tablet 3   JARDIANCE 25 MG TABS tablet TAKE 1 TABLET BY MOUTH  DAILY BEFORE BREAKFAST 90 tablet 3   losartan (COZAAR) 100 MG tablet TAKE 1 TABLET BY MOUTH  DAILY 90 tablet 3   metFORMIN (GLUCOPHAGE) 1000 MG tablet TAKE 1 TABLET BY MOUTH  TWICE DAILY WITH MEALS 180 tablet 3   pioglitazone (ACTOS) 30 MG tablet TAKE 1 TABLET BY MOUTH  DAILY 90 tablet 3   No current facility-administered medications on file prior to visit.   No Known Allergies Social History   Socioeconomic History   Marital status: Married    Spouse name: Not on file   Number of children: Not on file   Years of education: Not on file   Highest education level: Not on file  Occupational History   Not on file  Tobacco Use   Smoking status: Never   Smokeless tobacco: Never  Vaping Use   Vaping Use: Never used  Substance and Sexual Activity   Alcohol use: Not Currently    Alcohol/week: 30.0 standard drinks of alcohol    Types: 30 Cans of beer per week   Drug use: No   Sexual activity: Yes    Comment: married  Other Topics Concern   Not on file  Social History Narrative   Not on file   Social Determinants of Health   Financial Resource Strain: Not on file  Food Insecurity: Not on file  Transportation Needs: Not on file  Physical Activity: Not on file  Stress: Not on file  Social Connections: Not on file  Intimate Partner Violence: Not on file   Family History  Problem Relation Age of Onset   Cancer Mother        breast   Breast cancer Mother    Cancer Father        not sure  primary source    Sleep apnea Father    Colon cancer Neg Hx    Stomach cancer Neg Hx    Colon polyps Neg Hx    Esophageal cancer Neg Hx    Rectal cancer Neg Hx       Review of Systems  All other  systems reviewed and are negative.      Objective:   Physical Exam Vitals reviewed.  Constitutional:      General: He is not in acute distress.    Appearance: Normal appearance. He is well-developed. He is obese. He is not ill-appearing, toxic-appearing or diaphoretic.  HENT:     Head: Normocephalic and atraumatic.     Right Ear: Tympanic membrane, ear canal and external ear normal.     Left Ear: Tympanic membrane, ear canal and external ear normal.     Nose: Nose normal. No congestion or rhinorrhea.     Mouth/Throat:     Pharynx: No oropharyngeal exudate.  Eyes:     General: No scleral icterus.       Right eye: No discharge.        Left eye: No discharge.     Conjunctiva/sclera: Conjunctivae normal.     Pupils: Pupils are equal, round, and reactive to light.  Neck:     Thyroid: No thyromegaly.     Vascular: No carotid bruit or JVD.     Trachea: No tracheal deviation.  Cardiovascular:     Rate and Rhythm: Normal rate and regular rhythm.     Pulses: Normal pulses.     Heart sounds: Normal heart sounds. No murmur heard.    No friction rub. No gallop.  Pulmonary:     Effort: Pulmonary effort is normal. No respiratory distress.     Breath sounds: Normal breath sounds. No stridor. No wheezing, rhonchi or rales.  Chest:     Chest wall: No tenderness.  Abdominal:     General: Bowel sounds are normal. There is no distension.     Palpations: Abdomen is soft. There is no mass.     Tenderness: There is no abdominal tenderness. There is no guarding or rebound.  Musculoskeletal:     Cervical back: Neck supple. No rigidity or tenderness.     Right lower leg: Edema present.     Left lower leg: Edema present.  Lymphadenopathy:     Cervical: No cervical adenopathy.  Skin:     General: Skin is warm.     Coloration: Skin is not jaundiced or pale.     Findings: No bruising, erythema, lesion or rash.  Neurological:     General: No focal deficit present.     Mental Status: He is alert and oriented to person, place, and time. Mental status is at baseline.     Cranial Nerves: No cranial nerve deficit.     Sensory: No sensory deficit.     Motor: No weakness or abnormal muscle tone.     Coordination: Coordination normal.     Gait: Gait normal.     Deep Tendon Reflexes: Reflexes are normal and symmetric. Reflexes normal.  Psychiatric:        Behavior: Behavior normal.        Thought Content: Thought content normal.        Judgment: Judgment normal.           Assessment & Plan:  Morbid obesity ([place])  Diastolic dysfunction  Controlled type 2 diabetes mellitus without complication, without long-term current use of insulin ([place]) - Plan: Hemoglobin A1c, CBC with Differential/Platelet, Lipid panel, COMPLETE METABOLIC PANEL WITH GFR, Protein / Creatinine Ratio, Urine I think weight loss would help his breathing more than anything.  I would recommend a GLP-1 agonist such as Mounjaro, Ozempic, or Rybelsus.  Wife is going to check with her insurance to see  if this is covered.  If so we will start whichever medication is most affordable and uptitrate to the highest dose.  We will also discontinue glipizide as well as pioglitazone.  I believe that this would facilitate weight loss.  I believe that this will also help with some of the peripheral edema.  If it does not help with the peripheral edema we will switch hydrochlorothiazide to Lasix.  Patient is comfortable with this plan

## 2022-12-10 LAB — CBC WITH DIFFERENTIAL/PLATELET
Absolute Monocytes: 871 cells/uL (ref 200–950)
Basophils Absolute: 40 cells/uL (ref 0–200)
Basophils Relative: 0.6 %
Eosinophils Absolute: 53 cells/uL (ref 15–500)
Eosinophils Relative: 0.8 %
HCT: 44.9 % (ref 38.5–50.0)
Hemoglobin: 14.9 g/dL (ref 13.2–17.1)
Lymphs Abs: 1406 cells/uL (ref 850–3900)
MCH: 28.9 pg (ref 27.0–33.0)
MCHC: 33.2 g/dL (ref 32.0–36.0)
MCV: 87 fL (ref 80.0–100.0)
MPV: 11.7 fL (ref 7.5–12.5)
Monocytes Relative: 13.2 %
Neutro Abs: 4231 cells/uL (ref 1500–7800)
Neutrophils Relative %: 64.1 %
Platelets: 198 10*3/uL (ref 140–400)
RBC: 5.16 10*6/uL (ref 4.20–5.80)
RDW: 12.5 % (ref 11.0–15.0)
Total Lymphocyte: 21.3 %
WBC: 6.6 10*3/uL (ref 3.8–10.8)

## 2022-12-10 LAB — HEMOGLOBIN A1C
Hgb A1c MFr Bld: 6 % of total Hgb — ABNORMAL HIGH (ref <5.7)
Mean Plasma Glucose: 126 mg/dL
eAG (mmol/L): 7 mmol/L

## 2022-12-10 LAB — LIPID PANEL
Cholesterol: 103 mg/dL (ref <200)
HDL: 52 mg/dL (ref >=40)
LDL Cholesterol (Calc): 30 mg/dL (calc)
Non-HDL Cholesterol (Calc): 51 mg/dL (calc) (ref <130)
Total CHOL/HDL Ratio: 2 (calc) (ref <5.0)
Triglycerides: 120 mg/dL (ref <150)

## 2022-12-10 LAB — COMPLETE METABOLIC PANEL WITH GFR
AG Ratio: 1.7 (calc) (ref 1.0–2.5)
ALT: 42 U/L (ref 9–46)
AST: 25 U/L (ref 10–35)
Albumin: 4.2 g/dL (ref 3.6–5.1)
Alkaline phosphatase (APISO): 57 U/L (ref 35–144)
BUN/Creatinine Ratio: 21 (calc) (ref 6–22)
BUN: 14 mg/dL (ref 7–25)
CO2: 29 mmol/L (ref 20–32)
Calcium: 9.7 mg/dL (ref 8.6–10.3)
Chloride: 106 mmol/L (ref 98–110)
Creat: 0.68 mg/dL — ABNORMAL LOW (ref 0.70–1.35)
Globulin: 2.5 g/dL (calc) (ref 1.9–3.7)
Glucose, Bld: 126 mg/dL — ABNORMAL HIGH (ref 65–99)
Potassium: 5.6 mmol/L — ABNORMAL HIGH (ref 3.5–5.3)
Sodium: 143 mmol/L (ref 135–146)
Total Bilirubin: 0.6 mg/dL (ref 0.2–1.2)
Total Protein: 6.7 g/dL (ref 6.1–8.1)
eGFR: 104 mL/min/{1.73_m2} (ref >=60)

## 2022-12-10 LAB — PROTEIN / CREATININE RATIO, URINE
Creatinine, Urine: 66 mg/dL (ref 20–320)
Protein/Creat Ratio: 61 mg/g creat (ref 25–148)
Protein/Creatinine Ratio: 0.061 mg/mg creat (ref 0.025–0.148)
Total Protein, Urine: 4 mg/dL — ABNORMAL LOW (ref 5–25)

## 2022-12-12 ENCOUNTER — Other Ambulatory Visit: Payer: Self-pay

## 2022-12-12 DIAGNOSIS — I10 Essential (primary) hypertension: Secondary | ICD-10-CM

## 2022-12-12 MED ORDER — LOSARTAN POTASSIUM 50 MG PO TABS: 50.0000 mg | ORAL_TABLET | Freq: Every day | ORAL | 1 refills | Status: DC

## 2022-12-20 ENCOUNTER — Other Ambulatory Visit: Payer: Self-pay

## 2022-12-20 ENCOUNTER — Telehealth: Payer: Self-pay

## 2022-12-20 DIAGNOSIS — R0609 Other forms of dyspnea: Secondary | ICD-10-CM

## 2022-12-20 DIAGNOSIS — I10 Essential (primary) hypertension: Secondary | ICD-10-CM

## 2022-12-20 MED ORDER — FUROSEMIDE 40 MG PO TABS: 40.0000 mg | ORAL_TABLET | Freq: Every day | ORAL | 1 refills | Status: DC | PRN

## 2022-12-20 NOTE — Telephone Encounter (Signed)
Pt received Losartan rx from pharmacy and it was 50 mg  instead of 100 mg. Pt asks if he is supposed to decrease the dose? I did not see anything in his chart stating the rx was supposed to be decreased. Thank you.

## 2022-12-30 ENCOUNTER — Other Ambulatory Visit: Payer: Self-pay

## 2022-12-30 ENCOUNTER — Telehealth: Payer: Self-pay | Admitting: Family Medicine

## 2022-12-30 DIAGNOSIS — E119 Type 2 diabetes mellitus without complications: Secondary | ICD-10-CM

## 2022-12-30 MED ORDER — TIRZEPATIDE 7.5 MG/0.5ML ~~LOC~~ SOAJ: 7.5000 mg | SUBCUTANEOUS | 1 refills | Status: DC

## 2022-12-30 NOTE — Telephone Encounter (Signed)
Patient came to the office to speak with the nurse; requesting call back to discuss changes in medication.   Please advise at [phone]

## 2023-01-03 ENCOUNTER — Other Ambulatory Visit: Payer: Self-pay | Admitting: Family Medicine

## 2023-01-05 ENCOUNTER — Ambulatory Visit (INDEPENDENT_AMBULATORY_CARE_PROVIDER_SITE_OTHER): Payer: 59 | Admitting: Family Medicine

## 2023-01-05 DIAGNOSIS — E119 Type 2 diabetes mellitus without complications: Secondary | ICD-10-CM

## 2023-01-05 MED ORDER — TIRZEPATIDE 7.5 MG/0.5ML ~~LOC~~ SOAJ: 7.5000 mg | SUBCUTANEOUS | 1 refills | Status: DC

## 2023-01-05 NOTE — Progress Notes (Addendum)
Subjective:    Patient ID: Christopher Spence, male    DOB: [birth year], 62 y.o.   MRN: [id number]  HPI  04/08/22 At this CPE in May, I was concerned by his DOE and ordered an echo: 1. Left ventricular ejection fraction, by estimation, is 60 to 65%. The  left ventricle has normal function. The left ventricle has no regional  wall motion abnormalities. There is mild left ventricular hypertrophy.  Left ventricular diastolic parameters  are consistent with Grade I diastolic dysfunction (impaired relaxation).   2. Right ventricular systolic function is normal. The right ventricular  size is normal.   3. The mitral valve is grossly normal. No evidence of mitral valve  regurgitation.   4. The aortic valve is tricuspid. Aortic valve regurgitation is not  visualized.   5. Aortic dilatation noted. There is borderline dilatation of the aortic  root, measuring 38 mm.   Patient is here today with his wife to discuss.  I have recommended a sleep study.  I believe his diastolic dysfunction is due to his morbid obesity, longstanding hypertension, and likely undiagnosed sleep apnea.  I explained to the patient that the best [place] to treat his diastolic dysfunction is to treat the underlying causes.  First I recommended substantial weight loss.  Second I would recommend a sleep study to evaluate for what I believe is likely undiagnosed sleep apnea given his body habitus.  Third I would recommend treating his blood pressure.  He ensures me that that the only reason his blood pressure is high is that he is aggravated that he has to come to clinic today to discuss this.  He states that his blood pressure at home is better controlled.  At that time, my plan was: I would like to gather some more information before formulating a treatment plan.  I have recommended a referral to a sleep specialist to have a sleep study as I am very concerned that he likely has undiagnosed obstructive sleep apnea that is contributing to his  diastolic dysfunction and his dyspnea.  Patient was able to be convinced to see a sleep specialist.  He is already on Jardiance.  I would focus on weight loss to try to help reduce his morbid obesity.  I would like to switch the patient to [facility] and away from Actos however I want to get the results of the sleep study first before making this change.  But I did emphasize to the patient that he needs to change his diet to help lose weight to take the pressure off his heart as I feel that this is likely contributing to his shortness of breath as well.  Lastly I recommended treating his blood pressure however he insists that its under control at home.  12/09/22 Patient had OSA with AHI of 11 with O2 of 77&.  Currently on CPAP.  Patient states that his breathing may be slightly better.  He is definitely sleeping better.  However he continues to get winded with minimal activity.  He also has +1 to +2 edema in his legs.  Of note he is on pioglitazone.  His most recent A1c was 5.9.  He also has diastolic dysfunction.  He is currently on hydrochlorothiazide.  At that time, my plan was: I think weight loss would help his breathing more than anything.  I would recommend a GLP-1 agonist such as Mounjaro, Ozempic, or Rybelsus.  Wife is going to check with her insurance to see if this is covered.  If so we will start whichever medication is most affordable and uptitrate to the highest dose.  We will also discontinue glipizide as well as pioglitazone.  I believe that this would facilitate weight loss.  I believe that this will also help with some of the peripheral edema.  If it does not help with the peripheral edema we will switch hydrochlorothiazide to Lasix.  Patient is comfortable with this plan  01/05/23 Patient is currently on mounjaro 7.5 mg sq weekly.  We was supposed to discontinue pioglitazone.  He was also supposed to stop hydrochlorothiazide and replace with Lasix 40 mg a day.  Despite that he still has +1  edema in both legs distal to his knees.  He is still taking amlodipine.  He denies any chest pain or shortness of breath Wt Readings from Last 3 Encounters:  01/05/23 (!) 376 lb 3.2 oz (170.6 kg)  12/09/22 (!) 397 lb (180.1 kg)  12/05/22 (!) 403 lb 9.6 oz (183.1 kg)   Patient has lost 27 pounds! Past Medical History:  Diagnosis Date   Allergy    occ- mild   Arthritis    knees   Diabetes    Hyperlipidemia    Hypertension    Personal history of colonic polyps-adenomas 10/15/2012   Past Surgical History:  Procedure Laterality Date   COLONOSCOPY  10/15/2012   Procedure: COLONOSCOPY;  Surgeon: Iva Boop, MD;  Location: [facility];  Service: Endoscopy;  Laterality: N/A;   COLONOSCOPY     KNEE ARTHROSCOPY     bilateral; twice on each knee   KNEE ARTHROTOMY  1976   right   POLYPECTOMY     UMBILICAL HERNIA REPAIR     x 2    Current Outpatient Medications on File Prior to Visit  Medication Sig Dispense Refill   furosemide (LASIX) 40 MG tablet Take 1 tablet (40 mg total) by mouth daily as needed (For leg swelling.). 90 tablet 1   losartan (COZAAR) 50 MG tablet Take 1 tablet (50 mg total) by mouth daily. 90 tablet 1   aspirin 81 MG tablet Take 81 mg by mouth daily.     atorvastatin (LIPITOR) 20 MG tablet TAKE 1 TABLET BY MOUTH  DAILY 90 tablet 3   doxazosin (CARDURA) 4 MG tablet TAKE 1 TABLET BY MOUTH  DAILY 90 tablet 3   fish oil-omega-3 fatty acids 1000 MG capsule Take by mouth daily. Takes 2 capsules daily     glipiZIDE (GLUCOTROL) 5 MG tablet TAKE 1 TABLET BY MOUTH  DAILY BEFORE BREAKFAST 90 tablet 3   Green Tea, Camillia sinensis, (GREEN TEA PO) Take by mouth. Takes 2 green tea capsules daily     JARDIANCE 25 MG TABS tablet TAKE 1 TABLET BY MOUTH  DAILY BEFORE BREAKFAST 90 tablet 3   metFORMIN (GLUCOPHAGE) 1000 MG tablet TAKE 1 TABLET BY MOUTH  TWICE DAILY WITH MEALS 180 tablet 3   No current facility-administered medications on file prior to visit.    No Known  Allergies Social History   Socioeconomic History   Marital status: Married    Spouse name: Not on file   Number of children: Not on file   Years of education: Not on file   Highest education level: 12th grade  Occupational History   Not on file  Tobacco Use   Smoking status: Never   Smokeless tobacco: Never  Vaping Use   Vaping Use: Never used  Substance and Sexual Activity   Alcohol use: Not Currently  Alcohol/week: 30.0 standard drinks of alcohol    Types: 30 Cans of beer per week   Drug use: No   Sexual activity: Yes    Comment: married  Other Topics Concern   Not on file  Social History Narrative   Not on file   Social Determinants of Health   Financial Resource Strain: Low Risk  (01/04/2023)   Overall Financial Resource Strain ([place]ARD[place])    Difficulty of Paying Living Expenses: Not hard at all  Food Insecurity: No Food Insecurity (01/04/2023)   Hunger Vital Sign    Worried About Running Out of Food in the Last Year: Never true    Ran Out of Food in the Last Year: Never true  Transportation Needs: No Transportation Needs (01/04/2023)   PRAPARE - Administrator, Civil ServiceTransportation    Lack of Transportation (Medical): No    Lack of Transportation (Non-Medical): No  Physical Activity: Unknown (01/04/2023)   Exercise Vital Sign    Days of Exercise per Week: 0 days    Minutes of Exercise per Session: Not on file  Stress: No Stress Concern Present (01/04/2023)   [employer] - Occupational Stress Questionnaire    Feeling of Stress : Not at all  Social Connections: Moderately Isolated (01/04/2023)   Social Connection and Isolation Panel [NHANES]    Frequency of Communication with Friends and Family: Three times a week    Frequency of Social Gatherings with Friends and Family: More than three times a week    Attends Religious Services: Never    Database administratorActive Member of Clubs or Organizations: No    Attends Engineer, structuralClub or Organization Meetings: Not on file    Marital Status:  Married  Catering managerntimate Partner Violence: Not on file   Family History  Problem Relation Age of Onset   Cancer Mother        breast   Breast cancer Mother    Cancer Father        not sure primary source    Sleep apnea Father    Colon cancer Neg Hx    Stomach cancer Neg Hx    Colon polyps Neg Hx    Esophageal cancer Neg Hx    Rectal cancer Neg Hx       Review of Systems  All other systems reviewed and are negative.      Objective:   Physical Exam Vitals reviewed.  Constitutional:      General: He is not in acute distress.    Appearance: Normal appearance. He is well-developed. He is obese. He is not ill-appearing, toxic-appearing or diaphoretic.  HENT:     Head: Normocephalic and atraumatic.     Right Ear: Tympanic membrane, ear canal and external ear normal.     Left Ear: Tympanic membrane, ear canal and external ear normal.     Nose: Nose normal. No congestion or rhinorrhea.     Mouth/Throat:     Pharynx: No oropharyngeal exudate.  Eyes:     General: No scleral icterus.       Right eye: No discharge.        Left eye: No discharge.     Conjunctiva/sclera: Conjunctivae normal.     Pupils: Pupils are equal, round, and reactive to light.  Neck:     Thyroid: No thyromegaly.     Vascular: No carotid bruit or JVD.     Trachea: No tracheal deviation.  Cardiovascular:     Rate and Rhythm: Normal rate and regular rhythm.  Pulses: Normal pulses.     Heart sounds: Normal heart sounds. No murmur heard.    No friction rub. No gallop.  Pulmonary:     Effort: Pulmonary effort is normal. No respiratory distress.     Breath sounds: Normal breath sounds. No stridor. No wheezing, rhonchi or rales.  Chest:     Chest wall: No tenderness.  Abdominal:     General: Bowel sounds are normal. There is no distension.     Palpations: Abdomen is soft. There is no mass.     Tenderness: There is no abdominal tenderness. There is no guarding or rebound.  Musculoskeletal:     Cervical back:  Neck supple. No rigidity or tenderness.     Right lower leg: Edema present.     Left lower leg: Edema present.  Lymphadenopathy:     Cervical: No cervical adenopathy.  Skin:    General: Skin is warm.     Coloration: Skin is not jaundiced or pale.     Findings: No bruising, erythema, lesion or rash.  Neurological:     General: No focal deficit present.     Mental Status: He is alert and oriented to person, place, and time. Mental status is at baseline.     Cranial Nerves: No cranial nerve deficit.     Sensory: No sensory deficit.     Motor: No weakness or abnormal muscle tone.     Coordination: Coordination normal.     Gait: Gait normal.     Deep Tendon Reflexes: Reflexes are normal and symmetric. Reflexes normal.  Psychiatric:        Behavior: Behavior normal.        Thought Content: Thought content normal.        Judgment: Judgment normal.           Assessment & Plan:  Morbid obesity - Plan: BASIC METABOLIC PANEL WITH GFR, tirzepatide (MOUNJARO) 7.5 MG/0.5ML Pen, DISCONTINUED: tirzepatide (MOUNJARO) 7.5 MG/0.5ML Pen  Controlled type 2 diabetes mellitus without complication, without long-term current use of insulin - Plan: tirzepatide (MOUNJARO) 7.5 MG/0.5ML Pen, DISCONTINUED: tirzepatide (MOUNJARO) 7.5 MG/0.5ML Pen First discontinue amlodipine.  Monitor for 1 week to see if edema will improve just by stopping amlodipine.  Monitor blood pressure.  Patient will notify me if his blood pressure is greater than 140/90.  If discontinuing amlodipine does not help with swelling, the neck step will be to increase Lasix to 40 mg twice a day and add K-Dur 20 mill equivalents daily to that.  Monitor potassium and kidney function today with a BMP

## 2023-01-05 NOTE — Addendum Note (Signed)
Addended by: Lynnea Ferrier T on: 01/05/2023 11:07 AM   Modules accepted: Orders

## 2023-01-06 ENCOUNTER — Other Ambulatory Visit: Payer: Self-pay

## 2023-01-06 DIAGNOSIS — R0609 Other forms of dyspnea: Secondary | ICD-10-CM

## 2023-01-06 DIAGNOSIS — I10 Essential (primary) hypertension: Secondary | ICD-10-CM

## 2023-01-06 LAB — BASIC METABOLIC PANEL WITH GFR
BUN/Creatinine Ratio: 16 (calc) (ref 6–22)
BUN: 10 mg/dL (ref 7–25)
CO2: 28 mmol/L (ref 20–32)
Calcium: 9.8 mg/dL (ref 8.6–10.3)
Chloride: 103 mmol/L (ref 98–110)
Creat: 0.63 mg/dL — ABNORMAL LOW (ref 0.70–1.35)
Glucose, Bld: 127 mg/dL — ABNORMAL HIGH (ref 65–99)
Potassium: 4.6 mmol/L (ref 3.5–5.3)
Sodium: 142 mmol/L (ref 135–146)
eGFR: 107 mL/min/{1.73_m2} (ref >=60)

## 2023-01-06 MED ORDER — FUROSEMIDE 40 MG PO TABS: 40.0000 mg | ORAL_TABLET | Freq: Two times a day (BID) | ORAL | 1 refills | Status: DC

## 2023-01-27 ENCOUNTER — Other Ambulatory Visit: Payer: Self-pay

## 2023-01-27 DIAGNOSIS — E119 Type 2 diabetes mellitus without complications: Secondary | ICD-10-CM

## 2023-01-27 DIAGNOSIS — E7800 Pure hypercholesterolemia, unspecified: Secondary | ICD-10-CM

## 2023-01-27 MED ORDER — TIRZEPATIDE 10 MG/0.5ML ~~LOC~~ SOAJ: 10.0000 mg | SUBCUTANEOUS | 1 refills | Status: DC

## 2023-01-31 ENCOUNTER — Telehealth: Payer: Self-pay

## 2023-01-31 NOTE — Telephone Encounter (Signed)
Christopher Spence (Key: [id number])  Your information has been sent to [facility].

## 2023-02-16 ENCOUNTER — Encounter: Payer: Self-pay | Admitting: Family Medicine

## 2023-02-16 ENCOUNTER — Ambulatory Visit (INDEPENDENT_AMBULATORY_CARE_PROVIDER_SITE_OTHER): Payer: 59 | Admitting: Family Medicine

## 2023-02-16 VITALS — BP 126/72 | HR 113 | Temp 98.4°F | Ht 73.0 in | Wt 351.0 lb

## 2023-02-16 DIAGNOSIS — L723 Sebaceous cyst: Secondary | ICD-10-CM

## 2023-02-16 NOTE — Progress Notes (Signed)
Subjective:    Patient ID: Christopher Spence, male    DOB: 02-08-1961, 62 y.o.   MRN: [id number]  HPI  Patient presents today with an inflamed sebaceous cyst on his left occiput.  It is 3 cm in diameter Past Medical History:  Diagnosis Date  . Allergy    occ- mild  . Arthritis    knees  . Diabetes (HCC)   . Hyperlipidemia   . Hypertension   . Personal history of colonic polyps-adenomas 10/15/2012   Past Surgical History:  Procedure Laterality Date  . COLONOSCOPY  10/15/2012   Procedure: COLONOSCOPY;  Surgeon: Iva Boop, MD;  Location: [facility];  Service: Endoscopy;  Laterality: N/A;  . COLONOSCOPY    . KNEE ARTHROSCOPY     bilateral; twice on each knee  . KNEE ARTHROTOMY  1976   right  . POLYPECTOMY    . UMBILICAL HERNIA REPAIR     x 2    Current Outpatient Medications on File Prior to Visit  Medication Sig Dispense Refill  . losartan (COZAAR) 50 MG tablet Take 1 tablet (50 mg total) by mouth daily. 90 tablet 1  . aspirin 81 MG tablet Take 81 mg by mouth daily.    Marland Kitchen atorvastatin (LIPITOR) 20 MG tablet TAKE 1 TABLET BY MOUTH  DAILY 90 tablet 3  . doxazosin (CARDURA) 4 MG tablet TAKE 1 TABLET BY MOUTH  DAILY 90 tablet 3  . fish oil-omega-3 fatty acids 1000 MG capsule Take by mouth daily. Takes 2 capsules daily    . furosemide (LASIX) 40 MG tablet Take 1 tablet (40 mg total) by mouth 2 (two) times daily. 90 tablet 1  . Green Tea, Camillia sinensis, (GREEN TEA PO) Take by mouth. Takes 2 green tea capsules daily    . JARDIANCE 25 MG TABS tablet TAKE 1 TABLET BY MOUTH  DAILY BEFORE BREAKFAST 90 tablet 3  . metFORMIN (GLUCOPHAGE) 1000 MG tablet TAKE 1 TABLET BY MOUTH  TWICE DAILY WITH MEALS 180 tablet 3  . tirzepatide (MOUNJARO) 10 MG/0.5ML Pen Inject 10 mg into the skin once a week. 6 mL 1   No current facility-administered medications on file prior to visit.    No Known Allergies Social History   Socioeconomic History  . Marital status: Married    Spouse name: Not  on file  . Number of children: Not on file  . Years of education: Not on file  . Highest education level: 12th grade  Occupational History  . Not on file  Tobacco Use  . Smoking status: Never  . Smokeless tobacco: Never  Vaping Use  . Vaping Use: Never used  Substance and Sexual Activity  . Alcohol use: Not Currently    Alcohol/week: 30.0 standard drinks of alcohol    Types: 30 Cans of beer per week  . Drug use: No  . Sexual activity: Yes    Comment: married  Other Topics Concern  . Not on file  Social History Narrative  . Not on file   Social Determinants of Health   Financial Resource Strain: Low Risk  (01/04/2023)   Overall Financial Resource Strain (CARDIA)   . Difficulty of Paying Living Expenses: Not hard at all  Food Insecurity: No Food Insecurity (01/04/2023)   Hunger Vital Sign   . Worried About Programme researcher, broadcasting/film/video in the Last Year: Never true   . Ran Out of Food in the Last Year: Never true  Transportation Needs: No Transportation Needs (01/04/2023)  PRAPARE - Transportation   . Lack of Transportation (Medical): No   . Lack of Transportation (Non-Medical): No  Physical Activity: Unknown (01/04/2023)   Exercise Vital Sign   . Days of Exercise per Week: 0 days   . Minutes of Exercise per Session: Not on file  Stress: No Stress Concern Present (01/04/2023)   [employer] of Occupational Health - Occupational Stress Questionnaire   . Feeling of Stress : Not at all  Social Connections: Moderately Isolated (01/04/2023)   Social Connection and Isolation Panel [NHANES]   . Frequency of Communication with Friends and Family: Three times a week   . Frequency of Social Gatherings with Friends and Family: More than three times a week   . Attends Religious Services: Never   . Active Member of Clubs or Organizations: No   . Attends Banker Meetings: Not on file   . Marital Status: Married  Catering manager Violence: Not on file   Family History  Problem  Relation Age of Onset  . Cancer Mother        breast  . Breast cancer Mother   . Cancer Father        not sure primary source   . Sleep apnea Father   . Colon cancer Neg Hx   . Stomach cancer Neg Hx   . Colon polyps Neg Hx   . Esophageal cancer Neg Hx   . Rectal cancer Neg Hx       Review of Systems  All other systems reviewed and are negative.      Objective:   Physical Exam Vitals reviewed.  Constitutional:      General: He is not in acute distress.    Appearance: Normal appearance. He is well-developed. He is obese. He is not ill-appearing, toxic-appearing or diaphoretic.  HENT:     Head: Normocephalic and atraumatic.      Right Ear: External ear normal.     Left Ear: External ear normal.  Neck:     Thyroid: No thyromegaly.     Vascular: No carotid bruit or JVD.     Trachea: No tracheal deviation.  Cardiovascular:     Rate and Rhythm: Normal rate and regular rhythm.     Pulses: Normal pulses.     Heart sounds: Normal heart sounds. No murmur heard.    No friction rub. No gallop.  Pulmonary:     Effort: Pulmonary effort is normal. No respiratory distress.     Breath sounds: Normal breath sounds. No stridor. No wheezing, rhonchi or rales.  Chest:     Chest wall: No tenderness.  Musculoskeletal:     Cervical back: Neck supple. No rigidity or tenderness.     Right lower leg: Edema present.     Left lower leg: Edema present.  Lymphadenopathy:     Cervical: No cervical adenopathy.  Skin:    General: Skin is warm.     Coloration: Skin is not jaundiced or pale.     Findings: No bruising, erythema, lesion or rash.  Neurological:     General: No focal deficit present.     Mental Status: He is alert and oriented to person, place, and time. Mental status is at baseline.     Cranial Nerves: No cranial nerve deficit.     Sensory: No sensory deficit.     Motor: No weakness or abnormal muscle tone.     Coordination: Coordination normal.     Gait: Gait normal.  Deep Tendon Reflexes: Reflexes are normal and symmetric. Reflexes normal.  Psychiatric:        Behavior: Behavior normal.        Thought Content: Thought content normal.        Judgment: Judgment normal.          Assessment & Plan:  Inflamed sebaceous cyst I anesthetized the lesion with 0.1% lidocaine.  I then made a 1 cm vertical incision directly in the center.  I opened the wound with a pair hemostats and expressed copious purulent material.  I estimate about 15 cc yellow foul-smelling cyst sac contents.  I packed the wound with 2 inches of 1/4 inch iodoform gauze.  Wound care was discussed.  Remove packing tomorrow.  Heal through secondary intention

## 2023-02-23 ENCOUNTER — Other Ambulatory Visit: Payer: Self-pay

## 2023-02-23 ENCOUNTER — Telehealth: Payer: Self-pay | Admitting: Family Medicine

## 2023-02-23 DIAGNOSIS — E119 Type 2 diabetes mellitus without complications: Secondary | ICD-10-CM

## 2023-02-23 MED ORDER — TIRZEPATIDE 12.5 MG/0.5ML ~~LOC~~ SOAJ: 12.5000 mg | SUBCUTANEOUS | 1 refills | Status: DC

## 2023-02-23 NOTE — Telephone Encounter (Signed)
Prescription Request  02/23/2023  LOV: 02/16/2023  What is the name of the medication or equipment?   tirzepatide [facility]) 10 MG/0.5ML Pen  **Patient requesting for dose to be increased to 12.5MG **  Have you contacted your pharmacy to request a refill? Yes   Which pharmacy would you like this sent to?  [id number] - [place], [place] - [address] [place] [address] Phone: [phone] Fax: [phone]    Patient notified that their request is being sent to the clinical staff for review and that they should receive a response within 2 business days.   Please advise patient when refill sent in at [phone].

## 2023-03-06 ENCOUNTER — Other Ambulatory Visit: Payer: Self-pay | Admitting: Family Medicine

## 2023-03-08 NOTE — Telephone Encounter (Signed)
Due to a glitch in the system the protocol does not pick up the last office visit for this practice.   LOV 01/05/2023.    Labs are in date.   Refills given since criteria in protocol is met.  Requested Prescriptions  Pending Prescriptions Disp Refills   atorvastatin (LIPITOR) 20 MG tablet [Pharmacy Med Name: Atorvastatin Calcium 20 MG Oral Tablet] 90 tablet 3    Sig: TAKE 1 TABLET BY MOUTH ONCE  DAILY     Cardiovascular:  Antilipid - Statins Failed - 03/06/2023 11:37 PM      Failed - Valid encounter within last 12 months    Recent Outpatient Visits           1 year ago Routine general medical examination at a health care facility   [facility] Christopher Spence, Christopher Heidelberg, MD   2 years ago Routine general medical examination at a health care facility   [facility] Medicine Christopher Spence, Christopher Heidelberg, MD   3 years ago Routine general medical examination at a health care facility   [facility] Medicine Christopher Spence, Christopher Heidelberg, MD   4 years ago Routine general medical examination at a health care facility   [facility] Christopher Spence, Christopher Heidelberg, MD   5 years ago Routine general medical examination at a health care facility   [facility] Medicine Christopher Spence, Christopher Heidelberg, MD              Failed - Lipid Panel in normal range within the last 12 months    Cholesterol  Date Value Ref Range Status  12/09/2022 103 <200 mg/dL Final   LDL Cholesterol (Calc)  Date Value Ref Range Status  12/09/2022 30 mg/dL (calc) Final    Comment:    Reference range: <100 . Desirable range <100 mg/dL for primary prevention;   <70 mg/dL for patients with CHD or diabetic patients  with > or = 2 CHD risk factors. Marland Kitchen LDL-C is now calculated using the Martin-Hopkins  calculation, which is a validated novel method providing  better accuracy than the Friedewald equation in the  estimation of LDL-C.  Horald Pollen et al. Lenox Ahr. 1610;960(45): 2061-2068   (http://education.[facility].com/faq/FAQ164)    HDL  Date Value Ref Range Status  12/09/2022 52 > OR = 40 mg/dL Final   Triglycerides  Date Value Ref Range Status  12/09/2022 120 <150 mg/dL Final         Passed - Patient is not pregnant       doxazosin (CARDURA) 4 MG tablet [Pharmacy Med Name: Doxazosin Mesylate 4 MG Oral Tablet] 90 tablet 3    Sig: TAKE 1 TABLET BY MOUTH DAILY     Cardiovascular:  Alpha Blockers Failed - 03/06/2023 11:37 PM      Failed - Valid encounter within last 6 months    Recent Outpatient Visits           1 year ago Routine general medical examination at a health care facility   [facility] Medicine Christopher Spence, Christopher Heidelberg, MD   2 years ago Routine general medical examination at a health care facility   [facility] Medicine Christopher Spence, Christopher Heidelberg, MD   3 years ago Routine general medical examination at a health care facility   [facility] Medicine Christopher Spence, Christopher Heidelberg, MD   4 years ago Routine general medical examination at a health care facility   [facility] Medicine Christopher Spence, Christopher Heidelberg, MD   5 years ago  Routine general medical examination at a health care facility   [facility] Medicine Christopher Spence, Christopher Heidelberg, MD              Passed - Last BP in normal range    BP Readings from Last 1 Encounters:  02/16/23 126/72          JARDIANCE 25 MG TABS tablet [Pharmacy Med Name: Jardiance 25 MG Oral Tablet] 90 tablet 3    Sig: TAKE 1 TABLET BY MOUTH DAILY  BEFORE BREAKFAST     Endocrinology:  Diabetes - SGLT2 Inhibitors Failed - 03/06/2023 11:37 PM      Failed - Cr in normal range and within 360 days    Creat  Date Value Ref Range Status  01/05/2023 0.63 (L) 0.70 - 1.35 mg/dL Final   Creatinine, Urine  Date Value Ref Range Status  12/09/2022 66 20 - 320 mg/dL Final         Failed - Valid encounter within last 6 months    Recent Outpatient Visits           1 year ago Routine general medical examination at a  health care facility   [facility] Christopher Spence, Christopher Heidelberg, MD   2 years ago Routine general medical examination at a health care facility   [facility] Medicine Christopher Spence, Christopher Heidelberg, MD   3 years ago Routine general medical examination at a health care facility   [facility] Medicine Christopher Spence, Christopher Heidelberg, MD   4 years ago Routine general medical examination at a health care facility   [facility] Medicine Christopher Spence, Christopher Heidelberg, MD   5 years ago Routine general medical examination at a health care facility   [facility] Medicine Christopher Spence, Christopher Heidelberg, MD              Passed - HBA1C is between 0 and 7.9 and within 180 days    Hgb A1c MFr Bld  Date Value Ref Range Status  12/09/2022 6.0 (H) <5.7 % of total Hgb Final    Comment:    For someone without known diabetes, a hemoglobin  A1c value between 5.7% and 6.4% is consistent with prediabetes and should be confirmed with a  follow-up test. . For someone with known diabetes, a value <7% indicates that their diabetes is well controlled. A1c targets should be individualized based on duration of diabetes, age, comorbid conditions, and other considerations. . This assay result is consistent with an increased risk of diabetes. . Currently, no consensus exists regarding use of hemoglobin A1c for diagnosis of diabetes for children. .          Passed - eGFR in normal range and within 360 days    GFR, Est African American  Date Value Ref Range Status  02/01/2021 112 > OR = 60 mL/min/1.79m2 Final   GFR, Est Non African American  Date Value Ref Range Status  02/01/2021 96 > OR = 60 mL/min/1.60m2 Final   eGFR  Date Value Ref Range Status  01/05/2023 107 > OR = 60 mL/min/1.20m2 Final          metFORMIN (GLUCOPHAGE) 1000 MG tablet [Pharmacy Med Name: metFORMIN HCl 1000 MG Oral Tablet] 180 tablet 3    Sig: TAKE 1 TABLET BY MOUTH TWICE  DAILY WITH MEALS     Endocrinology:  Diabetes - Biguanides  Failed - 03/06/2023 11:37 PM      Failed - Cr in normal range and within 360  days    Creat  Date Value Ref Range Status  01/05/2023 0.63 (L) 0.70 - 1.35 mg/dL Final   Creatinine, Urine  Date Value Ref Range Status  12/09/2022 66 20 - 320 mg/dL Final         Failed - B12 Level in normal range and within 720 days    No results found for: "VITAMINB12"       Failed - Valid encounter within last 6 months    Recent Outpatient Visits           1 year ago Routine general medical examination at a health care facility   [facility] Christopher Spence, Christopher Heidelberg, MD   2 years ago Routine general medical examination at a health care facility   [facility] Medicine Christopher Spence, Christopher Heidelberg, MD   3 years ago Routine general medical examination at a health care facility   [facility] Medicine Christopher Spence, Christopher Heidelberg, MD   4 years ago Routine general medical examination at a health care facility   [facility] Medicine Christopher Spence, Christopher Heidelberg, MD   5 years ago Routine general medical examination at a health care facility   [facility] Medicine Christopher Spence, Christopher Heidelberg, MD              Passed - HBA1C is between 0 and 7.9 and within 180 days    Hgb A1c MFr Bld  Date Value Ref Range Status  12/09/2022 6.0 (H) <5.7 % of total Hgb Final    Comment:    For someone without known diabetes, a hemoglobin  A1c value between 5.7% and 6.4% is consistent with prediabetes and should be confirmed with a  follow-up test. . For someone with known diabetes, a value <7% indicates that their diabetes is well controlled. A1c targets should be individualized based on duration of diabetes, age, comorbid conditions, and other considerations. . This assay result is consistent with an increased risk of diabetes. . Currently, no consensus exists regarding use of hemoglobin A1c for diagnosis of diabetes for children. .          Passed - eGFR in normal range and within 360 days    GFR, Est  African American  Date Value Ref Range Status  02/01/2021 112 > OR = 60 mL/min/1.11m2 Final   GFR, Est Non African American  Date Value Ref Range Status  02/01/2021 96 > OR = 60 mL/min/1.28m2 Final   eGFR  Date Value Ref Range Status  01/05/2023 107 > OR = 60 mL/min/1.31m2 Final         Passed - CBC within normal limits and completed in the last 12 months    WBC  Date Value Ref Range Status  12/09/2022 6.6 3.8 - 10.8 Thousand/uL Final   RBC  Date Value Ref Range Status  12/09/2022 5.16 4.20 - 5.80 Million/uL Final   Hemoglobin  Date Value Ref Range Status  12/09/2022 14.9 13.2 - 17.1 g/dL Final   HCT  Date Value Ref Range Status  12/09/2022 44.9 38.5 - 50.0 % Final   MCHC  Date Value Ref Range Status  12/09/2022 33.2 32.0 - 36.0 g/dL Final   [facility]  Date Value Ref Range Status  12/09/2022 28.9 27.0 - 33.0 pg Final   MCV  Date Value Ref Range Status  12/09/2022 87.0 80.0 - 100.0 fL Final   No results found for: "PLTCOUNTKUC", "LABPLAT", "POCPLA" RDW  Date Value Ref Range Status  12/09/2022 12.5 11.0 -  15.0 % Final

## 2023-03-21 ENCOUNTER — Telehealth: Payer: Self-pay

## 2023-03-21 NOTE — Telephone Encounter (Signed)
Pt called in to request a refill of this med tirzepatide [facility]) 12.5. Pt also is asking for an increase in the dosage amount of this med. Pt also states that he will need a PA for this med as well. Pt would like for nurse to send an email concerning the amount of refills he will be able to get if the dosage is increased. Please advise.  LOV: 02/16/23  PHARMACY: [facility] [id number], [place] - [address] [place] [address] Phone: [phone]  Fax: [phone]    CB# [phone]/EMAIL [email or web address]

## 2023-03-22 ENCOUNTER — Other Ambulatory Visit: Payer: Self-pay

## 2023-03-22 DIAGNOSIS — I10 Essential (primary) hypertension: Secondary | ICD-10-CM

## 2023-03-22 DIAGNOSIS — E119 Type 2 diabetes mellitus without complications: Secondary | ICD-10-CM

## 2023-03-22 DIAGNOSIS — E7800 Pure hypercholesterolemia, unspecified: Secondary | ICD-10-CM

## 2023-03-22 MED ORDER — TIRZEPATIDE 15 MG/0.5ML ~~LOC~~ SOAJ
15.0000 mg | SUBCUTANEOUS | 3 refills | Status: DC
Start: 2023-03-22 — End: 2024-02-05

## 2023-03-24 NOTE — Telephone Encounter (Signed)
Christopher Spence (Key: [id number])  Your information has been sent to [facility].

## 2023-04-03 NOTE — Telephone Encounter (Signed)
Rec' fax from [facility], fax stated that this medication does not need PA, per pt's insurance plan. This medicaiton is a list cover drug.   For questions please [facility] pharmacy at [phone]

## 2023-04-05 ENCOUNTER — Other Ambulatory Visit: Payer: Self-pay | Admitting: Family Medicine

## 2023-04-05 DIAGNOSIS — I10 Essential (primary) hypertension: Secondary | ICD-10-CM

## 2023-04-05 DIAGNOSIS — R0609 Other forms of dyspnea: Secondary | ICD-10-CM

## 2023-04-05 NOTE — Telephone Encounter (Signed)
Requested Prescriptions  Pending Prescriptions Disp Refills   furosemide (LASIX) 40 MG tablet [Pharmacy Med Name: Furosemide 40 MG Oral Tablet] 180 tablet 0    Sig: TAKE 1 TABLET BY MOUTH TWICE  DAILY     Cardiovascular:  Diuretics - Loop Failed - 04/05/2023  1:52 AM      Failed - Cr in normal range and within 180 days    Creat  Date Value Ref Range Status  01/05/2023 0.63 (L) 0.70 - 1.35 mg/dL Final   Creatinine, Urine  Date Value Ref Range Status  12/09/2022 66 20 - 320 mg/dL Final         Failed - Mg Level in normal range and within 180 days    No results found for: "MG"       Failed - Valid encounter within last 6 months    Recent Outpatient Visits           1 year ago Routine general medical examination at a health care facility   [facility] Medicine Pickard, Priscille Heidelberg, MD   2 years ago Routine general medical examination at a health care facility   [facility] Medicine Pickard, Priscille Heidelberg, MD   3 years ago Routine general medical examination at a health care facility   [facility] Medicine Donita Brooks, MD   4 years ago Routine general medical examination at a health care facility   [facility] Medicine Pickard, Priscille Heidelberg, MD   5 years ago Routine general medical examination at a health care facility   [facility] Medicine Pickard, Priscille Heidelberg, MD              Passed - K in normal range and within 180 days    Potassium  Date Value Ref Range Status  01/05/2023 4.6 3.5 - 5.3 mmol/L Final         Passed - Ca in normal range and within 180 days    Calcium  Date Value Ref Range Status  01/05/2023 9.8 8.6 - 10.3 mg/dL Final         Passed - Na in normal range and within 180 days    Sodium  Date Value Ref Range Status  01/05/2023 142 135 - 146 mmol/L Final         Passed - Cl in normal range and within 180 days    Chloride  Date Value Ref Range Status  01/05/2023 103 98 - 110 mmol/L Final         Passed - Last BP in  normal range    BP Readings from Last 1 Encounters:  02/16/23 126/72

## 2023-04-24 ENCOUNTER — Other Ambulatory Visit: Payer: Self-pay | Admitting: Family Medicine

## 2023-04-24 DIAGNOSIS — I10 Essential (primary) hypertension: Secondary | ICD-10-CM

## 2023-04-25 NOTE — Telephone Encounter (Signed)
Requested by interface surescripts. Last OV 01/05/23. Requested Prescriptions  Pending Prescriptions Disp Refills   losartan (COZAAR) 50 MG tablet [Pharmacy Med Name: Losartan Potassium 50 MG Oral Tablet] 90 tablet 2    Sig: TAKE 1 TABLET BY MOUTH DAILY     Cardiovascular:  Angiotensin Receptor Blockers Failed - 04/24/2023 10:03 PM      Failed - Cr in normal range and within 180 days    Creat  Date Value Ref Range Status  01/05/2023 0.63 (L) 0.70 - 1.35 mg/dL Final   Creatinine, Urine  Date Value Ref Range Status  12/09/2022 66 20 - 320 mg/dL Final         Failed - Valid encounter within last 6 months    Recent Outpatient Visits           1 year ago Routine general medical examination at a health care facility   [facility] Medicine Pickard, Priscille Heidelberg, MD   2 years ago Routine general medical examination at a health care facility   [facility] Medicine Pickard, Priscille Heidelberg, MD   3 years ago Routine general medical examination at a health care facility   [facility] Medicine Pickard, Priscille Heidelberg, MD   4 years ago Routine general medical examination at a health care facility   [facility] Medicine Pickard, Priscille Heidelberg, MD   5 years ago Routine general medical examination at a health care facility   [facility] Medicine Pickard, Priscille Heidelberg, MD              Passed - K in normal range and within 180 days    Potassium  Date Value Ref Range Status  01/05/2023 4.6 3.5 - 5.3 mmol/L Final         Passed - Patient is not pregnant      Passed - Last BP in normal range    BP Readings from Last 1 Encounters:  02/16/23 126/72

## 2023-05-09 ENCOUNTER — Other Ambulatory Visit: Payer: Self-pay

## 2023-05-09 DIAGNOSIS — R0609 Other forms of dyspnea: Secondary | ICD-10-CM

## 2023-05-09 DIAGNOSIS — I10 Essential (primary) hypertension: Secondary | ICD-10-CM

## 2023-05-09 MED ORDER — FUROSEMIDE 40 MG PO TABS
40.0000 mg | ORAL_TABLET | Freq: Two times a day (BID) | ORAL | 1 refills | Status: DC
Start: 2023-05-09 — End: 2023-05-15

## 2023-05-15 ENCOUNTER — Other Ambulatory Visit: Payer: Self-pay | Admitting: Family Medicine

## 2023-05-15 DIAGNOSIS — R0609 Other forms of dyspnea: Secondary | ICD-10-CM

## 2023-05-15 DIAGNOSIS — I10 Essential (primary) hypertension: Secondary | ICD-10-CM

## 2023-05-15 NOTE — Telephone Encounter (Signed)
Patient called to request for provider to send a new script of furosemide (LASIX) 40 MG tablet to   Pharmacy:   [facility] [place], [place] - [address] [place] [address] Phone: [phone]  Fax: [phone]

## 2023-05-16 MED ORDER — FUROSEMIDE 40 MG PO TABS
40.0000 mg | ORAL_TABLET | Freq: Two times a day (BID) | ORAL | 2 refills | Status: DC
Start: 2023-05-16 — End: 2024-01-11

## 2023-05-16 NOTE — Telephone Encounter (Signed)
Requested Prescriptions  Pending Prescriptions Disp Refills   furosemide (LASIX) 40 MG tablet 180 tablet 1    Sig: Take 1 tablet (40 mg total) by mouth 2 (two) times daily.     Cardiovascular:  Diuretics - Loop Failed - 05/15/2023  9:34 AM      Failed - Cr in normal range and within 180 days    Creat  Date Value Ref Range Status  01/05/2023 0.63 (L) 0.70 - 1.35 mg/dL Final   Creatinine, Urine  Date Value Ref Range Status  12/09/2022 66 20 - 320 mg/dL Final         Failed - Mg Level in normal range and within 180 days    No results found for: "MG"       Failed - Valid encounter within last 6 months    Recent Outpatient Visits           1 year ago Routine general medical examination at a health care facility   [facility] Pickard, Priscille Heidelberg, MD   2 years ago Routine general medical examination at a health care facility   [facility] Pickard, Priscille Heidelberg, MD   3 years ago Routine general medical examination at a health care facility   [facility] Pickard, Priscille Heidelberg, MD   4 years ago Routine general medical examination at a health care facility   [facility] Donita Brooks, MD   5 years ago Routine general medical examination at a health care facility   [facility] Pickard, Priscille Heidelberg, MD       Future Appointments             In 1 month Pickard, Priscille Heidelberg, MD [facility]            Passed - K in normal range and within 180 days    Potassium  Date Value Ref Range Status  01/05/2023 4.6 3.5 - 5.3 mmol/L Final         Passed - Ca in normal range and within 180 days    Calcium  Date Value Ref Range Status  01/05/2023 9.8 8.6 - 10.3 mg/dL Final         Passed - Na in normal range and within 180 days    Sodium  Date Value Ref Range Status  01/05/2023 142 135 - 146 mmol/L Final         Passed - Cl in normal range and within 180 days    Chloride  Date  Value Ref Range Status  01/05/2023 103 98 - 110 mmol/L Final         Passed - Last BP in normal range    BP Readings from Last 1 Encounters:  02/16/23 126/72

## 2023-06-26 ENCOUNTER — Telehealth: Payer: Self-pay | Admitting: Neurology

## 2023-06-26 NOTE — Telephone Encounter (Signed)
Cancelling appt due everything is going good CPAP machine. Do not have shortness of breath and have lost 90 pounds.

## 2023-06-27 ENCOUNTER — Other Ambulatory Visit: Payer: 59

## 2023-06-27 DIAGNOSIS — E7800 Pure hypercholesterolemia, unspecified: Secondary | ICD-10-CM

## 2023-06-27 DIAGNOSIS — Z0000 Encounter for general adult medical examination without abnormal findings: Secondary | ICD-10-CM

## 2023-06-28 ENCOUNTER — Ambulatory Visit: Payer: Self-pay | Admitting: Adult Health

## 2023-06-29 ENCOUNTER — Ambulatory Visit: Payer: 59 | Admitting: Family Medicine

## 2023-06-29 VITALS — BP 124/82 | HR 76 | Temp 97.6°F | Ht 73.0 in | Wt 307.0 lb

## 2023-06-29 DIAGNOSIS — I10 Essential (primary) hypertension: Secondary | ICD-10-CM

## 2023-06-29 DIAGNOSIS — E119 Type 2 diabetes mellitus without complications: Secondary | ICD-10-CM

## 2023-06-29 DIAGNOSIS — Z0001 Encounter for general adult medical examination with abnormal findings: Secondary | ICD-10-CM

## 2023-06-29 DIAGNOSIS — I5189 Other ill-defined heart diseases: Secondary | ICD-10-CM

## 2023-06-29 DIAGNOSIS — E7800 Pure hypercholesterolemia, unspecified: Secondary | ICD-10-CM

## 2023-06-29 DIAGNOSIS — Z0000 Encounter for general adult medical examination without abnormal findings: Secondary | ICD-10-CM

## 2023-06-29 DIAGNOSIS — Z7984 Long term (current) use of oral hypoglycemic drugs: Secondary | ICD-10-CM

## 2023-06-29 NOTE — Progress Notes (Signed)
Subjective:    Patient ID: Christopher Spence, male    DOB: 04-09-1961, 62 y.o.   MRN: [id number]  HPI  Patient is a very pleasant 62 year old Caucasian male here today for complete physical exam.   Colonoscopy 2020, recommend repeat in 5 years.   Wt Readings from Last 3 Encounters:  06/29/23 (!) 307 lb (139.3 kg)  02/16/23 (!) 351 lb (159.2 kg)  01/05/23 (!) 376 lb 3.2 oz (170.6 kg)   Since starting the [facility], the patient has lost almost 90 pounds.  His breathing is much better.  His blood pressure is outstanding.  His hemoglobin A1c is well-controlled at 6.1.  He is having occasional episodes of hypotension.  He is due for a flu shot.  He is due for COVID vaccination.  He has had a shingles vaccine at an outside pharmacy.  He prefers to get the flu shot and the COVID vaccination at his pharmacy. Past Medical History:  Diagnosis Date   Allergy    occ- mild   Arthritis    knees   Diabetes (HCC)    Hyperlipidemia    Hypertension    Personal history of colonic polyps-adenomas 10/15/2012   Past Surgical History:  Procedure Laterality Date   COLONOSCOPY  10/15/2012   Procedure: COLONOSCOPY;  Surgeon: Iva Boop, MD;  Location: [facility];  Service: Endoscopy;  Laterality: N/A;   COLONOSCOPY     KNEE ARTHROSCOPY     bilateral; twice on each knee   KNEE ARTHROTOMY  1976   right   POLYPECTOMY     UMBILICAL HERNIA REPAIR     x 2   Current Outpatient Medications on File Prior to Visit  Medication Sig Dispense Refill   aspirin 81 MG tablet Take 81 mg by mouth daily.     atorvastatin (LIPITOR) 20 MG tablet TAKE 1 TABLET BY MOUTH ONCE  DAILY 90 tablet 3   doxazosin (CARDURA) 4 MG tablet TAKE 1 TABLET BY MOUTH DAILY 90 tablet 3   fish oil-omega-3 fatty acids 1000 MG capsule Take by mouth daily. Takes 2 capsules daily     furosemide (LASIX) 40 MG tablet Take 1 tablet (40 mg total) by mouth 2 (two) times daily. 180 tablet 2   Green Tea, Camillia sinensis, (GREEN TEA PO) Take by  mouth. Takes 2 green tea capsules daily     JARDIANCE 25 MG TABS tablet TAKE 1 TABLET BY MOUTH DAILY  BEFORE BREAKFAST 90 tablet 3   losartan (COZAAR) 50 MG tablet TAKE 1 TABLET BY MOUTH DAILY 90 tablet 2   metFORMIN (GLUCOPHAGE) 1000 MG tablet TAKE 1 TABLET BY MOUTH TWICE  DAILY WITH MEALS 180 tablet 3   tirzepatide (MOUNJARO) 15 MG/0.5ML Pen Inject 15 mg into the skin once a week. 6 mL 3   No current facility-administered medications on file prior to visit.   No Known Allergies Social History   Socioeconomic History   Marital status: Married    Spouse name: Not on file   Number of children: Not on file   Years of education: Not on file   Highest education level: 12th grade  Occupational History   Not on file  Tobacco Use   Smoking status: Never   Smokeless tobacco: Never  Vaping Use   Vaping status: Never Used  Substance and Sexual Activity   Alcohol use: Not Currently    Alcohol/week: 30.0 standard drinks of alcohol    Types: 30 Cans of beer per week   Drug  use: No   Sexual activity: Yes    Comment: married  Other Topics Concern   Not on file  Social History Narrative   Not on file   Social Determinants of Health   Financial Resource Strain: Low Risk  (01/04/2023)   Overall Financial Resource Strain (CARDIA)    Difficulty of Paying Living Expenses: Not hard at all  Food Insecurity: No Food Insecurity (01/04/2023)   Hunger Vital Sign    Worried About Running Out of Food in the Last Year: Never true    Ran Out of Food in the Last Year: Never true  Transportation Needs: No Transportation Needs (01/04/2023)   PRAPARE - Administrator, Civil Service (Medical): No    Lack of Transportation (Non-Medical): No  Physical Activity: Unknown (01/04/2023)   Exercise Vital Sign    Days of Exercise per Week: 0 days    Minutes of Exercise per Session: Not on file  Stress: No Stress Concern Present (01/04/2023)   [employer] of Occupational Health - Occupational  Stress Questionnaire    Feeling of Stress : Not at all  Social Connections: Moderately Isolated (01/04/2023)   Social Connection and Isolation Panel [NHANES]    Frequency of Communication with Friends and Family: Three times a week    Frequency of Social Gatherings with Friends and Family: More than three times a week    Attends Religious Services: Never    Database administrator or Organizations: No    Attends Engineer, structural: Not on file    Marital Status: Married  Catering manager Violence: Not on file   Family History  Problem Relation Age of Onset   Cancer Mother        breast   Breast cancer Mother    Cancer Father        not sure primary source    Sleep apnea Father    Colon cancer Neg Hx    Stomach cancer Neg Hx    Colon polyps Neg Hx    Esophageal cancer Neg Hx    Rectal cancer Neg Hx       Review of Systems  All other systems reviewed and are negative.      Objective:   Physical Exam Vitals reviewed.  Constitutional:      General: He is not in acute distress.    Appearance: Normal appearance. He is well-developed. He is obese. He is not ill-appearing, toxic-appearing or diaphoretic.  HENT:     Head: Normocephalic and atraumatic.     Right Ear: Tympanic membrane, ear canal and external ear normal.     Left Ear: Tympanic membrane, ear canal and external ear normal.     Nose: Nose normal. No congestion or rhinorrhea.     Mouth/Throat:     Pharynx: No oropharyngeal exudate.  Eyes:     General: No scleral icterus.       Right eye: No discharge.        Left eye: No discharge.     Conjunctiva/sclera: Conjunctivae normal.     Pupils: Pupils are equal, round, and reactive to light.  Neck:     Thyroid: No thyromegaly.     Vascular: No carotid bruit or JVD.     Trachea: No tracheal deviation.  Cardiovascular:     Rate and Rhythm: Normal rate and regular rhythm.     Pulses: Normal pulses.     Heart sounds: Normal heart sounds. No murmur heard.  No friction rub. No gallop.  Pulmonary:     Effort: Pulmonary effort is normal. No respiratory distress.     Breath sounds: Normal breath sounds. No stridor. No wheezing, rhonchi or rales.  Chest:     Chest wall: No tenderness.  Abdominal:     General: Bowel sounds are normal. There is no distension.     Palpations: Abdomen is soft. There is no mass.     Tenderness: There is no abdominal tenderness. There is no guarding or rebound.  Musculoskeletal:     Cervical back: Neck supple. No rigidity or tenderness.     Right lower leg: Edema present.     Left lower leg: Edema present.  Lymphadenopathy:     Cervical: No cervical adenopathy.  Skin:    General: Skin is warm.     Coloration: Skin is not jaundiced or pale.     Findings: No bruising, erythema, lesion or rash.  Neurological:     General: No focal deficit present.     Mental Status: He is alert and oriented to person, place, and time. Mental status is at baseline.     Cranial Nerves: No cranial nerve deficit.     Sensory: No sensory deficit.     Motor: No weakness or abnormal muscle tone.     Coordination: Coordination normal.     Gait: Gait normal.     Deep Tendon Reflexes: Reflexes are normal and symmetric. Reflexes normal.  Psychiatric:        Behavior: Behavior normal.        Thought Content: Thought content normal.        Judgment: Judgment normal.           Assessment & Plan:  Routine general medical examination at a health care facility  Diastolic dysfunction  Primary hypertension  Morbid obesity (HCC)  Pure hypercholesterolemia  Controlled type 2 diabetes mellitus without complication, without long-term current use of insulin (HCC) Patient's blood pressure today is outstanding as is his  A1c.  We are going to try to wean the patient off metformin.  I also recommended that he decrease his Lasix and try to wean off this as well.  Recheck in January to repeat A1c.  If the patient is able to successfully wean  off these medications we may also try to discontinue doxazosin in the future.  Patient will get his flu shot and COVID shot at his pharmacy.  Diabetic foot exam was performed today and was normal.  Recommended diabetic eye exam.  Cholesterol was not checked.  We will check this at his next visit.  Colonoscopy is up-to-date.  Will check a PSA today.

## 2023-06-30 LAB — TEST AUTHORIZATION

## 2023-06-30 LAB — COMPLETE METABOLIC PANEL WITH GFR
AG Ratio: 1.4 (calc) (ref 1.0–2.5)
ALT: 21 U/L (ref 9–46)
AST: 16 U/L (ref 10–35)
Albumin: 3.7 g/dL (ref 3.6–5.1)
Alkaline phosphatase (APISO): 76 U/L (ref 35–144)
BUN/Creatinine Ratio: 15 (calc) (ref 6–22)
BUN: 9 mg/dL (ref 7–25)
CO2: 29 mmol/L (ref 20–32)
Calcium: 9.5 mg/dL (ref 8.6–10.3)
Chloride: 103 mmol/L (ref 98–110)
Creat: 0.61 mg/dL — ABNORMAL LOW (ref 0.70–1.35)
Globulin: 2.7 g/dL (ref 1.9–3.7)
Glucose, Bld: 114 mg/dL — ABNORMAL HIGH (ref 65–99)
Potassium: 4.4 mmol/L (ref 3.5–5.3)
Sodium: 141 mmol/L (ref 135–146)
Total Bilirubin: 0.7 mg/dL (ref 0.2–1.2)
Total Protein: 6.4 g/dL (ref 6.1–8.1)
eGFR: 108 mL/min/{1.73_m2} (ref >=60)

## 2023-06-30 LAB — CBC WITH DIFFERENTIAL/PLATELET
Absolute Monocytes: 698 {cells}/uL (ref 200–950)
Basophils Absolute: 43 {cells}/uL (ref 0–200)
Basophils Relative: 0.6 %
Eosinophils Absolute: 86 {cells}/uL (ref 15–500)
Eosinophils Relative: 1.2 %
HCT: 46.5 % (ref 38.5–50.0)
Hemoglobin: 15.1 g/dL (ref 13.2–17.1)
Lymphs Abs: 1771 {cells}/uL (ref 850–3900)
MCH: 27.6 pg (ref 27.0–33.0)
MCHC: 32.5 g/dL (ref 32.0–36.0)
MCV: 84.9 fL (ref 80.0–100.0)
MPV: 11.4 fL (ref 7.5–12.5)
Monocytes Relative: 9.7 %
Neutro Abs: 4601 {cells}/uL (ref 1500–7800)
Neutrophils Relative %: 63.9 %
Platelets: 249 10*3/uL (ref 140–400)
RBC: 5.48 10*6/uL (ref 4.20–5.80)
RDW: 13.2 % (ref 11.0–15.0)
Total Lymphocyte: 24.6 %
WBC: 7.2 10*3/uL (ref 3.8–10.8)

## 2023-06-30 LAB — PSA: PSA: 2.58 ng/mL (ref <=4.00)

## 2023-06-30 LAB — HEMOGLOBIN A1C
Hgb A1c MFr Bld: 6.1 %{Hb} — ABNORMAL HIGH (ref <5.7)
Mean Plasma Glucose: 128 mg/dL
eAG (mmol/L): 7.1 mmol/L

## 2023-10-11 ENCOUNTER — Other Ambulatory Visit: Payer: 59

## 2023-10-11 DIAGNOSIS — E119 Type 2 diabetes mellitus without complications: Secondary | ICD-10-CM

## 2023-10-12 LAB — HEMOGLOBIN A1C
Hgb A1c MFr Bld: 5.9 %{Hb} — ABNORMAL HIGH (ref <5.7)
Mean Plasma Glucose: 123 mg/dL
eAG (mmol/L): 6.8 mmol/L

## 2023-12-20 ENCOUNTER — Other Ambulatory Visit: Payer: Self-pay | Admitting: Family Medicine

## 2023-12-20 DIAGNOSIS — I10 Essential (primary) hypertension: Secondary | ICD-10-CM

## 2023-12-22 NOTE — Telephone Encounter (Signed)
 Requested Prescriptions  Pending Prescriptions Disp Refills   losartan (COZAAR) 50 MG tablet [Pharmacy Med Name: Losartan Potassium 50 MG Oral Tablet] 90 tablet 1    Sig: TAKE 1 TABLET BY MOUTH DAILY     Cardiovascular:  Angiotensin Receptor Blockers Failed - 12/22/2023  9:52 AM      Failed - Cr in normal range and within 180 days    Creat  Date Value Ref Range Status  06/27/2023 0.61 (L) 0.70 - 1.35 mg/dL Final   Creatinine, Urine  Date Value Ref Range Status  12/09/2022 66 20 - 320 mg/dL Final         Passed - K in normal range and within 180 days    Potassium  Date Value Ref Range Status  06/27/2023 4.4 3.5 - 5.3 mmol/L Final         Passed - Patient is not pregnant      Passed - Last BP in normal range    BP Readings from Last 1 Encounters:  06/29/23 124/82         Passed - Valid encounter within last 6 months    Recent Outpatient Visits           5 months ago Routine general medical examination at a health care facility   [facility] Donita Brooks, MD   10 months ago Inflamed sebaceous cyst   [place] [facility] Donita Brooks, MD   11 months ago Morbid obesity [facility])   [place] [facility] Donita Brooks, MD   1 year ago Morbid obesity [facility])   [place] [facility] Pickard, Priscille Heidelberg, MD   1 year ago Morbid obesity [facility])   [place] [facility] Pickard, Priscille Heidelberg, MD

## 2024-01-09 ENCOUNTER — Other Ambulatory Visit: Payer: Self-pay | Admitting: Family Medicine

## 2024-01-09 DIAGNOSIS — I10 Essential (primary) hypertension: Secondary | ICD-10-CM

## 2024-01-09 DIAGNOSIS — R0609 Other forms of dyspnea: Secondary | ICD-10-CM

## 2024-01-11 NOTE — Telephone Encounter (Signed)
 Requested medication (s) are due for refill today:   Yes  Requested medication (s) are on the active medication list:   Yes  Future visit scheduled:   No.   LOV 06/29/2023 with Dr. Cheril Cork.   Last labs done on 06/27/2023.   Last ordered: 05/16/2023 #180, 2 refills  Unable to refill because labs are due per protocol.   Looks like he is seen yearly.   Returned for provider to review for refills.   Requested Prescriptions  Pending Prescriptions Disp Refills   furosemide (LASIX) 40 MG tablet [Pharmacy Med Name: Furosemide 40 MG Oral Tablet] 180 tablet 3    Sig: TAKE 1 TABLET BY MOUTH TWICE  DAILY     Cardiovascular:  Diuretics - Loop Failed - 01/11/2024  8:13 AM      Failed - K in normal range and within 180 days    Potassium  Date Value Ref Range Status  06/27/2023 4.4 3.5 - 5.3 mmol/L Final         Failed - Ca in normal range and within 180 days    Calcium  Date Value Ref Range Status  06/27/2023 9.5 8.6 - 10.3 mg/dL Final         Failed - Na in normal range and within 180 days    Sodium  Date Value Ref Range Status  06/27/2023 141 135 - 146 mmol/L Final         Failed - Cr in normal range and within 180 days    Creat  Date Value Ref Range Status  06/27/2023 0.61 (L) 0.70 - 1.35 mg/dL Final   Creatinine, Urine  Date Value Ref Range Status  12/09/2022 66 20 - 320 mg/dL Final         Failed - Cl in normal range and within 180 days    Chloride  Date Value Ref Range Status  06/27/2023 103 98 - 110 mmol/L Final         Failed - Mg Level in normal range and within 180 days    No results found for: "MG"       Failed - Valid encounter within last 6 months    Recent Outpatient Visits           6 months ago Routine general medical examination at a health care facility   [facility] Austine Lefort, MD   10 months ago Inflamed sebaceous cyst   [place] [facility] Austine Lefort, MD   1 year ago Morbid obesity [facility])    [place] [facility] Austine Lefort, MD   1 year ago Morbid obesity [facility])   [place] [facility] Austine Lefort, MD   1 year ago Morbid obesity [facility])   [place] [facility] Pickard, Cisco Crest, MD              Passed - Last BP in normal range    BP Readings from Last 1 Encounters:  06/29/23 124/82

## 2024-01-23 ENCOUNTER — Other Ambulatory Visit: Payer: Self-pay | Admitting: Family Medicine

## 2024-02-01 ENCOUNTER — Other Ambulatory Visit: Payer: Self-pay | Admitting: Family Medicine

## 2024-02-01 DIAGNOSIS — I10 Essential (primary) hypertension: Secondary | ICD-10-CM

## 2024-02-01 DIAGNOSIS — E7800 Pure hypercholesterolemia, unspecified: Secondary | ICD-10-CM

## 2024-02-01 DIAGNOSIS — E119 Type 2 diabetes mellitus without complications: Secondary | ICD-10-CM

## 2024-02-05 NOTE — Telephone Encounter (Signed)
 Requested medication (s) are due for refill today: yes  Requested medication (s) are on the active medication list: yes  Last refill:  03/22/23  Future visit scheduled: no  Notes to clinic:  Medication not assigned to a protocol, review manually.      Requested Prescriptions  Pending Prescriptions Disp Refills   MOUNJARO  15 MG/0.5ML Pen [Pharmacy Med Name: Mounjaro  Subcutaneous Solution Auto-injector 15 MG/0.5ML] 6 mL 0    Sig: INJECT 15MG  INTO THE SKIN ONE WEEKLY     Off-Protocol Failed - 02/05/2024  9:25 AM      Failed - Medication not assigned to a protocol, review manually.      Passed - Valid encounter within last 12 months    Recent Outpatient Visits           7 months ago Routine general medical examination at a health care facility   [facility] Austine Lefort, MD   11 months ago Inflamed sebaceous cyst   [place] [facility] Cheril Cork, Cisco Crest, MD   1 year ago Morbid obesity [facility])   [place] [facility] Austine Lefort, MD   1 year ago Morbid obesity [facility])   [facility] Austine Lefort, MD   1 year ago Morbid obesity [facility])   [place] [facility] Pickard, Cisco Crest, MD

## 2024-04-27 ENCOUNTER — Encounter: Payer: Self-pay | Admitting: Internal Medicine

## 2024-05-03 ENCOUNTER — Encounter: Payer: Self-pay | Admitting: Internal Medicine

## 2024-06-09 ENCOUNTER — Other Ambulatory Visit: Payer: Self-pay | Admitting: Family Medicine

## 2024-06-09 DIAGNOSIS — I10 Essential (primary) hypertension: Secondary | ICD-10-CM

## 2024-06-24 ENCOUNTER — Ambulatory Visit (AMBULATORY_SURGERY_CENTER): Admitting: *Deleted

## 2024-06-24 ENCOUNTER — Encounter: Payer: Self-pay | Admitting: Internal Medicine

## 2024-06-24 VITALS — Ht 73.0 in | Wt 300.0 lb

## 2024-06-24 DIAGNOSIS — Z860100 Personal history of colon polyps, unspecified: Secondary | ICD-10-CM

## 2024-06-24 DIAGNOSIS — Z800 Family history of malignant neoplasm of digestive organs: Secondary | ICD-10-CM

## 2024-06-24 MED ORDER — NA SULFATE-K SULFATE-MG SULF 17.5-3.13-1.6 GM/177ML PO SOLN: 1.0000 | Freq: Once | ORAL | 0 refills | Status: AC

## 2024-06-24 NOTE — Progress Notes (Signed)
 Pt's name and DOB verified at the beginning of the pre-visit with 2 identifiers  Permission given to speak with wife while on phone  Pt denies any difficulty with ambulating,sitting, laying down or rolling side to side  Pt has no issues moving head neck or swallowing  No egg or soy allergy known to patient   No issues known to pt with past sedation  No FH of Malignant Hyperthermia  Pt is not on home 02   Pt is not on blood thinners   Pt denies issues with constipation   Pt is not on dialysis  Pt denise any abnormal heart rhythms   Pt denies any upcoming cardiac testing  Patient's chart reviewed by Norleen Schillings CNRA prior to pre-visit and patient appropriate for the [facility].  Pre-visit completed and red dot placed by patient's name on their procedure day (on provider's schedule).    Visit by phone  Pt states weight is 300 lb  Pt given  both [facility] main # and MD on call # prior to instructions.  Informed pt to come in at the time discussed and is shown on PV instructions.  Pt instructed to use Singlecare.com or GoodRx for a price reduction on prep  Instructed pt where to find PV instructions in My Ch. Copy of instructions  to be sent in mail and address read back to pt to verify correct on envelope. Instructed pt on all aspects of written instructions including med holds clothing to wear and foods to eat and not eat as well as after procedure legal restrictions and to call MD on call if needed.. Pt states understanding. Instructed pt to review instructions again prior to procedure and call main # given if has any questions or any issues. Pt states they will.

## 2024-07-07 NOTE — Progress Notes (Unsigned)
 Christopher Spence   Primary Care Physician:  Duanne Butler DASEN, MD   Reason for Procedure:  History of colon polyps  Plan:    Colonoscopy     HPI: Christopher Spence is a 63 y.o. male here for surveillance colonoscopy exam today.  His mother also had colon cancer.  He had 3 adenomas the largest of which was 12 mm in 2014 and no polyps in 2020.   Past Medical History:  Diagnosis Date   Allergy    occ- mild   Arthritis    knees   Diabetes (HCC)    Hyperlipidemia    Hypertension    Personal history of colonic polyps-adenomas 10/15/2012   Sleep apnea     Past Surgical History:  Procedure Laterality Date   COLONOSCOPY  10/15/2012   Procedure: COLONOSCOPY;  Surgeon: Lupita FORBES Commander, MD;  Location: [facility];  Service: Endoscopy;  Laterality: N/A;   COLONOSCOPY     Family history of colon cancer     KNEE ARTHROSCOPY     bilateral; twice on each knee   KNEE ARTHROTOMY  1976   right   POLYPECTOMY     UMBILICAL HERNIA REPAIR     x 2     Current Outpatient Medications  Medication Sig Dispense Refill   aspirin 81 MG tablet Take 81 mg by mouth daily.     atorvastatin  (LIPITOR) 20 MG tablet TAKE 1 TABLET BY MOUTH ONCE  DAILY 90 tablet 3   doxazosin  (CARDURA ) 4 MG tablet TAKE 1 TABLET BY MOUTH DAILY 90 tablet 3   fish oil-omega-3 fatty acids 1000 MG capsule Take by mouth daily. Takes 2 capsules daily     furosemide  (LASIX ) 40 MG tablet TAKE 1 TABLET BY MOUTH TWICE  DAILY 180 tablet 3   Green Tea, Camillia sinensis, (GREEN TEA PO) Take by mouth. Takes 2 green tea capsules daily     JARDIANCE  25 MG TABS tablet TAKE 1 TABLET BY MOUTH DAILY  BEFORE BREAKFAST 90 tablet 3   losartan  (COZAAR ) 50 MG tablet TAKE 1 TABLET BY MOUTH DAILY 90 tablet 3   metFORMIN  (GLUCOPHAGE ) 1000 MG tablet TAKE 1 TABLET BY MOUTH TWICE  DAILY WITH MEALS 180 tablet 3   tirzepatide  (MOUNJARO ) 15 MG/0.5ML Pen INJECT 15MG  INTO THE SKIN ONE WEEKLY 6 mL 3   Turmeric (QC TUMERIC COMPLEX  PO) Take by mouth daily at 12 noon.     No current facility-administered medications for this visit.    Allergies as of 07/08/2024   (No Known Allergies)    Family History  Problem Relation Age of Onset   Colon cancer Mother    Cancer Mother        breast   Breast cancer Mother    Cancer Father        not sure primary source    Sleep apnea Father    Stomach cancer Neg Hx    Colon polyps Neg Hx    Esophageal cancer Neg Hx    Rectal cancer Neg Hx     Social History   Socioeconomic History   Marital status: Married    Spouse name: Not on file   Number of children: Not on file   Years of education: Not on file   Highest education level: 12th grade  Occupational History   Not on file  Tobacco Use   Smoking status: Never   Smokeless tobacco: Never  Vaping Use   Vaping status: Never Used  Substance and Sexual Activity   Alcohol use: Not Currently    Alcohol/week: 30.0 standard drinks of alcohol    Types: 30 Cans of beer per week   Drug use: No   Sexual activity: Yes    Comment: married  Other Topics Concern   Not on file  Social History Narrative   Not on file   Social Drivers of Health   Financial Resource Strain: Low Risk  (01/04/2023)   Overall Financial Resource Strain (CARDIA)    Difficulty of Paying Living Expenses: Not hard at all  Food Insecurity: No Food Insecurity (01/04/2023)   Hunger Vital Sign    Worried About Running Out of Food in the Last Year: Never true    Ran Out of Food in the Last Year: Never true  Transportation Needs: No Transportation Needs (01/04/2023)   PRAPARE - Administrator, Civil Service (Medical): No    Lack of Transportation (Non-Medical): No  Spence Activity: Unknown (01/04/2023)   Exercise Vital Sign    Days of Exercise per Week: 0 days    Minutes of Exercise per Session: Not on file  Stress: No Stress Concern Present (01/04/2023)   [employer] of Occupational Health - Occupational Stress Questionnaire     Feeling of Stress : Not at all  Social Connections: Moderately Isolated (01/04/2023)   Social Connection and Isolation Panel    Frequency of Communication with Friends and Family: Three times a week    Frequency of Social Gatherings with Friends and Family: More than three times a week    Attends Religious Services: Never    Database administrator or Organizations: No    Attends Engineer, structural: Not on file    Marital Status: Married  Catering manager Violence: Not on file    Review of Systems: Positive for *** All other review of systems negative except as mentioned in the HPI.  Spence Exam: Vital signs There were no vitals taken for this visit.  General:   Alert,  Well-developed, well-nourished, pleasant and cooperative in NAD Lungs:  Clear throughout to auscultation.   Heart:  Regular rate and rhythm; no murmurs, clicks, rubs,  or gallops. Abdomen:  Soft, nontender and nondistended. Normal bowel sounds.   Neuro/Psych:  Alert and cooperative. Normal mood and affect. A and O x 3   @Hanh Kertesz  CHARLENA Commander, MD, [facility] [phone] (pager) 07/07/2024 7:03 PM@

## 2024-07-08 ENCOUNTER — Telehealth: Payer: Self-pay

## 2024-07-08 ENCOUNTER — Encounter: Payer: Self-pay | Admitting: Internal Medicine

## 2024-07-08 ENCOUNTER — Ambulatory Visit: Admitting: Internal Medicine

## 2024-07-08 VITALS — BP 148/70 | HR 64 | Temp 98.8°F | Resp 13 | Ht 73.0 in | Wt 300.0 lb

## 2024-07-08 DIAGNOSIS — K648 Other hemorrhoids: Secondary | ICD-10-CM | POA: Diagnosis not present

## 2024-07-08 DIAGNOSIS — D120 Benign neoplasm of cecum: Secondary | ICD-10-CM

## 2024-07-08 DIAGNOSIS — M25562 Pain in left knee: Secondary | ICD-10-CM | POA: Insufficient documentation

## 2024-07-08 DIAGNOSIS — K5730 Diverticulosis of large intestine without perforation or abscess without bleeding: Secondary | ICD-10-CM | POA: Diagnosis not present

## 2024-07-08 DIAGNOSIS — Z860100 Personal history of colon polyps, unspecified: Secondary | ICD-10-CM

## 2024-07-08 DIAGNOSIS — Z860101 Personal history of adenomatous and serrated colon polyps: Secondary | ICD-10-CM

## 2024-07-08 DIAGNOSIS — Z1211 Encounter for screening for malignant neoplasm of colon: Secondary | ICD-10-CM

## 2024-07-08 MED ORDER — SODIUM CHLORIDE 0.9 % IV SOLN: 500.0000 mL | Freq: Once | INTRAVENOUS | Status: AC

## 2024-07-08 NOTE — Progress Notes (Signed)
 Pt's states no medical or surgical changes since previsit or office visit.

## 2024-07-08 NOTE — Op Note (Signed)
 [facility] Patient Name: Christopher Spence Procedure Date: 07/08/2024 9:12 AM MRN: [id number] Endoscopist: Lupita FORBES Commander , MD, [id number] Age: 63 Referring MD:  Date of Birth: 1961/05/04 Gender: Male Account #: [id number] Procedure:                Colonoscopy Indications:              High risk colon cancer surveillance: Personal                            history of colonic polyps, Last colonoscopy: 2020 Medicines:                Monitored Anesthesia Care Procedure:                Pre-Anesthesia Assessment:                           - Prior to the procedure, a History and Physical                            was performed, and patient medications and                            allergies were reviewed. The patient's tolerance of                            previous anesthesia was also reviewed. The risks                            and benefits of the procedure and the sedation                            options and risks were discussed with the patient.                            All questions were answered, and informed consent                            was obtained. Prior Anticoagulants: The patient has                            taken no anticoagulant or antiplatelet agents. ASA                            Grade Assessment: III - A patient with severe                            systemic disease. After reviewing the risks and                            benefits, the patient was deemed in satisfactory                            condition to undergo the procedure.  After obtaining informed consent, the colonoscope                            was passed under direct vision. Throughout the                            procedure, the patient's blood pressure, pulse, and                            oxygen saturations were monitored continuously. The                            Olympus Scope [id number] was introduced through the                            anus and  advanced to the the cecum, identified by                            appendiceal orifice and ileocecal valve. The                            colonoscopy was performed without difficulty. The                            patient tolerated the procedure well. The quality                            of the bowel preparation was good. The ileocecal                            valve, appendiceal orifice, and rectum were                            photographed. The bowel preparation used was SUPREP                            via split dose instruction. Scope In: 9:33:02 AM Scope Out: 9:45:40 AM Scope Withdrawal Time: 0 hours 10 minutes 50 seconds  Total Procedure Duration: 0 hours 12 minutes 38 seconds  Findings:                 The perianal and digital rectal examinations were                            normal.                           A 7 mm polyp was found in the cecum. The polyp was                            sessile. The polyp was removed with a cold snare.                            Resection and retrieval were complete. Verification  of patient identification for the specimen was                            done. Estimated blood loss was minimal.                           A few diverticula were found in the sigmoid colon.                           Internal hemorrhoids were found.                           The exam was otherwise without abnormality on                            direct and retroflexion views. Complications:            No immediate complications. Estimated Blood Loss:     Estimated blood loss was minimal. Impression:               - One 7 mm polyp in the cecum, removed with a cold                            snare. Resected and retrieved.                           - Diverticulosis in the sigmoid colon.                           - Internal hemorrhoids.                           - The examination was otherwise normal on direct                             and retroflexion views.                           - Personal history of colonic polyps. Mother may                            have had colon cancer but > 60                           10/15/2012 - 3 adenomas max 12 mm                           03/04/2019 no polyps Recommendation:           - Patient has a contact number available for                            emergencies. The signs and symptoms of potential                            delayed complications were discussed with the  patient. Return to normal activities tomorrow.                            Written discharge instructions were provided to the                            patient.                           - Resume previous diet.                           - Continue present medications.                           - Repeat colonoscopy is recommended for                            surveillance. The colonoscopy date will be                            determined after pathology results from today's                            exam become available for review. Lupita FORBES Commander, MD 07/08/2024 9:56:03 AM This report has been signed electronically.

## 2024-07-08 NOTE — Progress Notes (Signed)
 Called to room to assist during endoscopic procedure.  Patient ID and intended procedure confirmed with present staff. Received instructions for my participation in the procedure from the performing physician.

## 2024-07-08 NOTE — Progress Notes (Signed)
 To pacu, VSS. Report to [email or web address].tb

## 2024-07-08 NOTE — Telephone Encounter (Signed)
 Copied from CRM [id number]. Topic: Clinical - Medical Advice >> Jul 08, 2024  3:51 PM Shanda MATSU wrote: Reason for CRM: Patient is going to undergo knee replacement surgery, the provider who is going to perform surgery gave patient a form for patient's PCP to fill out, patient is wanting to know if PCP needs to see him in order to have form filled out, patient stated that blood work needs to be done before knee surgery can take place, patient stated blood work has not been done in 6 months which is why he is thinking PC will need to see him, is req a call back for clarification.

## 2024-07-08 NOTE — Patient Instructions (Addendum)
 One small polyp removed today. I will let you know pathology results and when to have another routine colonoscopy by mail and/or My Chart.  You also have a condition called diverticulosis - common and not usually a problem. Please read the handout provided. Hemorrhoids were slightly swollen also.  I will let you know pathology results and when to have another routine colonoscopy by mail and/or My Chart.  I appreciate the opportunity to care for you. Lupita CHARLENA Commander, MD, FACG   YOU HAD AN ENDOSCOPIC PROCEDURE TODAY AT THE [facility]:   Refer to the procedure report that was given to you for any specific questions about what was found during the examination.  If the procedure report does not answer your questions, please call your gastroenterologist to clarify.  If you requested that your care partner not be given the details of your procedure findings, then the procedure report has been included in a sealed envelope for you to review at your convenience later.  YOU SHOULD EXPECT: Some feelings of bloating in the abdomen. Passage of more gas than usual.  Walking can help get rid of the air that was put into your GI tract during the procedure and reduce the bloating. If you had a lower endoscopy (such as a colonoscopy or flexible sigmoidoscopy) you may notice spotting of blood in your stool or on the toilet paper. If you underwent a bowel prep for your procedure, you may not have a normal bowel movement for a few days.  Please Note:  You might notice some irritation and congestion in your nose or some drainage.  This is from the oxygen used during your procedure.  There is no need for concern and it should clear up in a day or so.  SYMPTOMS TO REPORT IMMEDIATELY:  Following lower endoscopy (colonoscopy or flexible sigmoidoscopy):  Excessive amounts of blood in the stool  Significant tenderness or worsening of abdominal pains  Swelling of the abdomen that is new, acute  Fever of  100F or higher  For urgent or emergent issues, a gastroenterologist can be reached at any hour by calling [phone]. Do not use MyChart messaging for urgent concerns.    DIET:  We do recommend a small meal at first, but then you may proceed to your regular diet.  Drink plenty of fluids but you should avoid alcoholic beverages for 24 hours.  MEDICATIONS: Continue present medications.  FOLLOW UP: Repeat colonoscopy is recommended for surveillance. The colonoscopy date will be determined after pathology results from today's exam become available for review.  Educational handouts given to patient: Polyps, Diverticulosis, Hemorrhoids.  Thank you for allowing us  to provide for your healthcare needs today.  ACTIVITY:  You should plan to take it easy for the rest of today and you should NOT DRIVE or use heavy machinery until tomorrow (because of the sedation medicines used during the test).    FOLLOW UP: Our staff will call the number listed on your records the next business day following your procedure.  We will call around 7:15- 8:00 am to check on you and address any questions or concerns that you may have regarding the information given to you following your procedure. If we do not reach you, we will leave a message.     If any biopsies were taken you will be contacted by phone or by letter within the next 1-3 weeks.  Please call us  at [phone] if you have not heard about the biopsies in  3 weeks.    SIGNATURES/CONFIDENTIALITY: You and/or your care partner have signed paperwork which will be entered into your electronic medical record.  These signatures attest to the fact that that the information above on your After Visit Summary has been reviewed and is understood.  Full responsibility of the confidentiality of this discharge information lies with you and/or your care-partner.

## 2024-07-09 ENCOUNTER — Encounter: Payer: Self-pay | Admitting: Family Medicine

## 2024-07-09 ENCOUNTER — Telehealth: Payer: Self-pay | Admitting: *Deleted

## 2024-07-09 ENCOUNTER — Ambulatory Visit (INDEPENDENT_AMBULATORY_CARE_PROVIDER_SITE_OTHER): Admitting: Family Medicine

## 2024-07-09 VITALS — BP 134/82 | HR 115 | Temp 97.7°F | Ht 71.75 in | Wt 294.0 lb

## 2024-07-09 DIAGNOSIS — I10 Essential (primary) hypertension: Secondary | ICD-10-CM

## 2024-07-09 DIAGNOSIS — E7800 Pure hypercholesterolemia, unspecified: Secondary | ICD-10-CM | POA: Diagnosis not present

## 2024-07-09 DIAGNOSIS — Z7984 Long term (current) use of oral hypoglycemic drugs: Secondary | ICD-10-CM

## 2024-07-09 DIAGNOSIS — E119 Type 2 diabetes mellitus without complications: Secondary | ICD-10-CM

## 2024-07-09 NOTE — Progress Notes (Signed)
 Subjective:    Patient ID: Christopher Spence, male    DOB: [birth year], 63 y.o.   MRN: [id number]  HPI  Patient is a very pleasant 63 year old Caucasian male here today for surgical clearance.  The patient is scheduled for a knee replacement.  He is due to recheck a hemoglobin A1c.  His last hemoglobin A1c was well below 6.5 in January.  He denies any polyuria or polydipsia or blurry vision.  He denies any chest pain or shortness of breath.  He denies any angina.  He has lost weight down to 294 pounds.  His orthopedist wants him to lose 15 more.  He is already on the maximum dose of Mounjaro  as well as Jardiance .  His blood pressure is well-controlled at 134/82. Past Medical History:  Diagnosis Date   Allergy    occ- mild   Arthritis    knees   Diabetes (HCC)    Hyperlipidemia    Hypertension    Personal history of colonic polyps-adenomas 10/15/2012   Sleep apnea    Past Surgical History:  Procedure Laterality Date   COLONOSCOPY  10/15/2012   Procedure: COLONOSCOPY;  Surgeon: Lupita FORBES Commander, MD;  Location: [facility];  Service: Endoscopy;  Laterality: N/A;   COLONOSCOPY     Family history of colon cancer     KNEE ARTHROSCOPY     bilateral; twice on each knee   KNEE ARTHROTOMY  1976   right   POLYPECTOMY     UMBILICAL HERNIA REPAIR     x 2   Current Outpatient Medications on File Prior to Visit  Medication Sig Dispense Refill   aspirin 81 MG tablet Take 81 mg by mouth daily.     atorvastatin  (LIPITOR) 20 MG tablet TAKE 1 TABLET BY MOUTH ONCE  DAILY 90 tablet 3   doxazosin  (CARDURA ) 4 MG tablet TAKE 1 TABLET BY MOUTH DAILY 90 tablet 3   furosemide  (LASIX ) 40 MG tablet TAKE 1 TABLET BY MOUTH TWICE  DAILY 180 tablet 3   Green Tea, Camillia sinensis, (GREEN TEA PO) Take by mouth. Takes 2 green tea capsules daily (Patient not taking: Reported on 07/08/2024)     JARDIANCE  25 MG TABS tablet TAKE 1 TABLET BY MOUTH DAILY  BEFORE BREAKFAST 90 tablet 3   KRILL OIL PO Take by mouth daily.      losartan  (COZAAR ) 50 MG tablet TAKE 1 TABLET BY MOUTH DAILY 90 tablet 3   tirzepatide  (MOUNJARO ) 15 MG/0.5ML Pen INJECT 15MG  INTO THE SKIN ONE WEEKLY 6 mL 3   Turmeric (QC TUMERIC COMPLEX PO) Take by mouth daily at 12 noon.     Current Facility-Administered Medications on File Prior to Visit  Medication Dose Route Frequency Provider Last Rate Last Admin   0.9 %  sodium chloride  infusion  500 mL Intravenous Once Commander Lupita FORBES, MD       No Known Allergies Social History   Socioeconomic History   Marital status: Married    Spouse name: Not on file   Number of children: Not on file   Years of education: Not on file   Highest education level: 12th grade  Occupational History   Not on file  Tobacco Use   Smoking status: Never   Smokeless tobacco: Never  Vaping Use   Vaping status: Never Used  Substance and Sexual Activity   Alcohol use: Not Currently    Alcohol/week: 30.0 standard drinks of alcohol    Types: 30 Cans of beer per week  Drug use: No   Sexual activity: Yes    Comment: married  Other Topics Concern   Not on file  Social History Narrative   Not on file   Social Drivers of Health   Financial Resource Strain: Low Risk  (07/08/2024)   Overall Financial Resource Strain (CARDIA)    Difficulty of Paying Living Expenses: Not hard at all  Food Insecurity: No Food Insecurity (07/08/2024)   Hunger Vital Sign    Worried About Running Out of Food in the Last Year: Never true    Ran Out of Food in the Last Year: Never true  Transportation Needs: No Transportation Needs (07/08/2024)   PRAPARE - Administrator, Civil Service (Medical): No    Lack of Transportation (Non-Medical): No  Physical Activity: Inactive (07/08/2024)   Exercise Vital Sign    Days of Exercise per Week: 0 days    Minutes of Exercise per Session: Not on file  Stress: No Stress Concern Present (07/08/2024)   [employer] of Occupational Health - Occupational Stress Questionnaire     Feeling of Stress: Only a little  Social Connections: Moderately Integrated (07/08/2024)   Social Connection and Isolation Panel    Frequency of Communication with Friends and Family: More than three times a week    Frequency of Social Gatherings with Friends and Family: More than three times a week    Attends Religious Services: 1 to 4 times per year    Active Member of [employer] or Organizations: No    Attends Engineer, structural: Not on file    Marital Status: Married  Catering manager Violence: Not on file   Family History  Problem Relation Age of Onset   Colon cancer Mother    Cancer Mother        breast   Breast cancer Mother    Cancer Father        not sure primary source    Sleep apnea Father    Stomach cancer Neg Hx    Colon polyps Neg Hx    Esophageal cancer Neg Hx    Rectal cancer Neg Hx       Review of Systems  All other systems reviewed and are negative.      Objective:   Physical Exam Vitals reviewed.  Constitutional:      General: He is not in acute distress.    Appearance: Normal appearance. He is well-developed. He is obese. He is not ill-appearing, toxic-appearing or diaphoretic.  HENT:     Head: Normocephalic and atraumatic.     Right Ear: Tympanic membrane, ear canal and external ear normal.     Left Ear: Tympanic membrane, ear canal and external ear normal.     Nose: Nose normal. No congestion or rhinorrhea.     Mouth/Throat:     Pharynx: No oropharyngeal exudate.  Eyes:     General: No scleral icterus.       Right eye: No discharge.        Left eye: No discharge.     Conjunctiva/sclera: Conjunctivae normal.     Pupils: Pupils are equal, round, and reactive to light.  Neck:     Thyroid: No thyromegaly.     Vascular: No carotid bruit or JVD.     Trachea: No tracheal deviation.  Cardiovascular:     Rate and Rhythm: Normal rate and regular rhythm.     Pulses: Normal pulses.     Heart sounds: Normal heart sounds. No  murmur heard.     No friction rub. No gallop.  Pulmonary:     Effort: Pulmonary effort is normal. No respiratory distress.     Breath sounds: Normal breath sounds. No stridor. No wheezing, rhonchi or rales.  Chest:     Chest wall: No tenderness.  Abdominal:     General: Bowel sounds are normal. There is no distension.     Palpations: Abdomen is soft. There is no mass.     Tenderness: There is no abdominal tenderness. There is no guarding or rebound.  Musculoskeletal:     Cervical back: Neck supple. No rigidity or tenderness.     Right lower leg: Edema present.     Left lower leg: Edema present.  Lymphadenopathy:     Cervical: No cervical adenopathy.  Skin:    General: Skin is warm.     Coloration: Skin is not jaundiced or pale.     Findings: No bruising, erythema, lesion or rash.  Neurological:     General: No focal deficit present.     Mental Status: He is alert and oriented to person, place, and time. Mental status is at baseline.     Cranial Nerves: No cranial nerve deficit.     Sensory: No sensory deficit.     Motor: No weakness or abnormal muscle tone.     Coordination: Coordination normal.     Gait: Gait normal.     Deep Tendon Reflexes: Reflexes are normal and symmetric. Reflexes normal.  Psychiatric:        Behavior: Behavior normal.        Thought Content: Thought content normal.        Judgment: Judgment normal.           Assessment & Plan:  Controlled type 2 diabetes mellitus without complication, without long-term current use of insulin (HCC) - Plan: CBC with Differential/Platelet, Comprehensive metabolic panel with GFR, Lipid panel, Hemoglobin A1c, Microalbumin/Creatinine Ratio, Urine  Pure hypercholesterolemia  Hypertension, unspecified type  Morbid obesity (HCC) Patient's blood pressure is well-controlled.  Check hemoglobin A1c.  Ideally would like his A1c to be below 7.  Check a fasting lipid panel.  Goal LDL cholesterol is less than 100.  I recommended reducing his  calorie consumption to less than 1500/day.  Continue the Mounjaro  and the Jardiance .  Patient denies any chest pain or angina or symptoms of heart failure.  Assuming that his hemoglobin is within normal range and assuming his A1c is well-controlled he is medically cleared to proceed with his knee replacement.

## 2024-07-09 NOTE — Telephone Encounter (Signed)
 Post procedure follow up call placed, no answer and left VM.

## 2024-07-10 ENCOUNTER — Ambulatory Visit: Payer: Self-pay | Admitting: Family Medicine

## 2024-07-10 LAB — COMPREHENSIVE METABOLIC PANEL WITH GFR
AG Ratio: 1.6 (calc) (ref 1.0–2.5)
ALT: 22 U/L (ref 9–46)
AST: 14 U/L (ref 10–35)
Albumin: 4.5 g/dL (ref 3.6–5.1)
Alkaline phosphatase (APISO): 63 U/L (ref 35–144)
BUN/Creatinine Ratio: 25 (calc) — ABNORMAL HIGH (ref 6–22)
BUN: 17 mg/dL (ref 7–25)
CO2: 23 mmol/L (ref 20–32)
Calcium: 10.1 mg/dL (ref 8.6–10.3)
Chloride: 106 mmol/L (ref 98–110)
Creat: 0.69 mg/dL — ABNORMAL LOW (ref 0.70–1.35)
Globulin: 2.8 g/dL (ref 1.9–3.7)
Glucose, Bld: 146 mg/dL — ABNORMAL HIGH (ref 65–99)
Potassium: 4.6 mmol/L (ref 3.5–5.3)
Sodium: 142 mmol/L (ref 135–146)
Total Bilirubin: 0.9 mg/dL (ref 0.2–1.2)
Total Protein: 7.3 g/dL (ref 6.1–8.1)
eGFR: 103 mL/min/1.73m2 (ref >=60)

## 2024-07-10 LAB — CBC WITH DIFFERENTIAL/PLATELET
Absolute Lymphocytes: 876 {cells}/uL (ref 850–3900)
Absolute Monocytes: 396 {cells}/uL (ref 200–950)
Basophils Absolute: 12 {cells}/uL (ref 0–200)
Basophils Relative: 0.1 %
Eosinophils Absolute: 0 {cells}/uL — ABNORMAL LOW (ref 15–500)
Eosinophils Relative: 0 %
HCT: 53.5 % — ABNORMAL HIGH (ref 38.5–50.0)
Hemoglobin: 17.8 g/dL — ABNORMAL HIGH (ref 13.2–17.1)
MCH: 30.3 pg (ref 27.0–33.0)
MCHC: 33.3 g/dL (ref 32.0–36.0)
MCV: 91 fL (ref 80.0–100.0)
MPV: 11.9 fL (ref 7.5–12.5)
Monocytes Relative: 3.3 %
Neutro Abs: 10716 {cells}/uL — ABNORMAL HIGH (ref 1500–7800)
Neutrophils Relative %: 89.3 %
Platelets: 281 Thousand/uL (ref 140–400)
RBC: 5.88 Million/uL — ABNORMAL HIGH (ref 4.20–5.80)
RDW: 12 % (ref 11.0–15.0)
Total Lymphocyte: 7.3 %
WBC: 12 Thousand/uL — ABNORMAL HIGH (ref 3.8–10.8)

## 2024-07-10 LAB — HEMOGLOBIN A1C
Hgb A1c MFr Bld: 5.5 % (ref <5.7)
Mean Plasma Glucose: 111 mg/dL
eAG (mmol/L): 6.2 mmol/L

## 2024-07-10 LAB — SURGICAL PATHOLOGY

## 2024-07-10 LAB — LIPID PANEL
Cholesterol: 150 mg/dL (ref <200)
HDL: 61 mg/dL (ref >=40)
LDL Cholesterol (Calc): 72 mg/dL
Non-HDL Cholesterol (Calc): 89 mg/dL (ref <130)
Total CHOL/HDL Ratio: 2.5 (calc) (ref <5.0)
Triglycerides: 87 mg/dL (ref <150)

## 2024-07-10 LAB — MICROALBUMIN / CREATININE URINE RATIO
Creatinine, Urine: 66 mg/dL (ref 20–320)
Microalb Creat Ratio: 6 mg/g{creat} (ref <30)
Microalb, Ur: 0.4 mg/dL

## 2024-07-12 ENCOUNTER — Ambulatory Visit: Payer: Self-pay | Admitting: Internal Medicine

## 2024-07-12 ENCOUNTER — Telehealth: Payer: Self-pay

## 2024-07-12 DIAGNOSIS — Z860100 Personal history of colon polyps, unspecified: Secondary | ICD-10-CM

## 2024-07-12 NOTE — Telephone Encounter (Signed)
 Addressed in separate encounter.

## 2024-07-12 NOTE — Telephone Encounter (Signed)
 Copied from CRM #[id number]. Topic: General - Other >> Jul 12, 2024  9:34 AM Shanda MATSU wrote: Reason for CRM: Patient called in to check status of form he needs filled out for knee replacement surgery, called [facility] and spoke with Nanette who stated that she believes the form has already been sent because it is not on the provider's desk anymore, however, Elida will check on this and contact the patient back to confirm, adv patient of this info.

## 2024-07-31 ENCOUNTER — Telehealth: Payer: Self-pay | Admitting: Family Medicine

## 2024-07-31 NOTE — Telephone Encounter (Signed)
 Copied from CRM [id number]. Topic: Appointments - Scheduling Inquiry for Clinic >> Jul 31, 2024  9:39 AM Mesmerise C wrote: Reason for CRM: Patient would like Christopher Spence to follow back up with him regarding his question if he needs to redo his labs before his physical on 11/10

## 2024-08-05 ENCOUNTER — Encounter: Payer: Self-pay | Admitting: Family Medicine

## 2024-08-05 ENCOUNTER — Ambulatory Visit: Admitting: Family Medicine

## 2024-08-05 VITALS — BP 136/76 | HR 89 | Temp 97.7°F | Ht 71.75 in | Wt 285.0 lb

## 2024-08-05 DIAGNOSIS — Z0001 Encounter for general adult medical examination with abnormal findings: Secondary | ICD-10-CM | POA: Diagnosis not present

## 2024-08-05 DIAGNOSIS — Z125 Encounter for screening for malignant neoplasm of prostate: Secondary | ICD-10-CM

## 2024-08-05 DIAGNOSIS — I10 Essential (primary) hypertension: Secondary | ICD-10-CM

## 2024-08-05 DIAGNOSIS — I5189 Other ill-defined heart diseases: Secondary | ICD-10-CM | POA: Diagnosis not present

## 2024-08-05 DIAGNOSIS — Z0000 Encounter for general adult medical examination without abnormal findings: Secondary | ICD-10-CM

## 2024-08-05 NOTE — Progress Notes (Signed)
 Subjective:    Patient ID: Christopher Spence, male    DOB: [birth year], 63 y.o.   MRN: [id number]  HPI  Patient is a very pleasant 63 year old Caucasian male here today for complete physical exam.   Colonoscopy was performed this year. Wt Readings from Last 3 Encounters:  08/05/24 285 lb (129.3 kg)  07/09/24 294 lb (133.4 kg)  07/08/24 300 lb (136.1 kg)   Patient is due for prostate cancer screening.  He continues to lose significant weight on Mounjaro .  Recently had a near syncopal episode while looking up in his closet.  He states that he felt lightheaded and collapsed.  He immediately regained consciousness as soon as he hit the ground.  He felt better once he laid down.  He was feeling nauseated prior to passing out.  I believe he had a vasovagal episode likely related to dehydration, decreased oral intake, and medication.  His blood pressure here is normal however at home his systolic blood pressure has been around 119.  He declines a flu shot.  He is interested in COVID and RSV.  Most recent lab work as listed below. Office Visit on 07/09/2024  Component Date Value Ref Range Status   WBC 07/09/2024 12.0 (H)  3.8 - 10.8 Thousand/uL Final   RBC 07/09/2024 5.88 (H)  4.20 - 5.80 Million/uL Final   Hemoglobin 07/09/2024 17.8 (H)  13.2 - 17.1 g/dL Final   HCT 89/85/7974 53.5 (H)  38.5 - 50.0 % Final   MCV 07/09/2024 91.0  80.0 - 100.0 fL Final   MCH 07/09/2024 30.3  27.0 - 33.0 pg Final   MCHC 07/09/2024 33.3  32.0 - 36.0 g/dL Final   Comment: For adults, a slight decrease in the calculated MCHC value (in the range of 30 to 32 g/dL) is most likely not clinically significant; however, it should be interpreted with caution in correlation with other red cell parameters and the patient's clinical condition.    RDW 07/09/2024 12.0  11.0 - 15.0 % Final   Platelets 07/09/2024 281  140 - 400 Thousand/uL Final   MPV 07/09/2024 11.9  7.5 - 12.5 fL Final   Neutro Abs 07/09/2024 10,716 (H)  1,500 -  7,800 cells/uL Final   Absolute Lymphocytes 07/09/2024 876  850 - 3,900 cells/uL Final   Absolute Monocytes 07/09/2024 396  200 - 950 cells/uL Final   Eosinophils Absolute 07/09/2024 0 (L)  15 - 500 cells/uL Final   Basophils Absolute 07/09/2024 12  0 - 200 cells/uL Final   Neutrophils Relative % 07/09/2024 89.3  % Final   Total Lymphocyte 07/09/2024 7.3  % Final   Monocytes Relative 07/09/2024 3.3  % Final   Eosinophils Relative 07/09/2024 0.0  % Final   Basophils Relative 07/09/2024 0.1  % Final   Glucose, Bld 07/09/2024 146 (H)  65 - 99 mg/dL Final   Comment: .            Fasting reference interval . For someone without known diabetes, a glucose value >125 mg/dL indicates that they may have diabetes and this should be confirmed with a follow-up test. .    BUN 07/09/2024 17  7 - 25 mg/dL Final   Creat 89/85/7974 0.69 (L)  0.70 - 1.35 mg/dL Final   eGFR 89/85/7974 103  > OR = 60 mL/min/1.59m2 Final   BUN/Creatinine Ratio 07/09/2024 25 (H)  6 - 22 (calc) Final   Sodium 07/09/2024 142  135 - 146 mmol/L Final   Potassium 07/09/2024 4.6  3.5 - 5.3 mmol/L Final   Chloride 07/09/2024 106  98 - 110 mmol/L Final   CO2 07/09/2024 23  20 - 32 mmol/L Final   Calcium  07/09/2024 10.1  8.6 - 10.3 mg/dL Final   Total Protein 89/85/7974 7.3  6.1 - 8.1 g/dL Final   Albumin 89/85/7974 4.5  3.6 - 5.1 g/dL Final   Globulin 89/85/7974 2.8  1.9 - 3.7 g/dL (calc) Final   AG Ratio 07/09/2024 1.6  1.0 - 2.5 (calc) Final   Total Bilirubin 07/09/2024 0.9  0.2 - 1.2 mg/dL Final   Alkaline phosphatase (APISO) 07/09/2024 63  35 - 144 U/L Final   AST 07/09/2024 14  10 - 35 U/L Final   ALT 07/09/2024 22  9 - 46 U/L Final   Cholesterol 07/09/2024 150  <200 mg/dL Final   HDL 89/85/7974 61  > OR = 40 mg/dL Final   Triglycerides 89/85/7974 87  <150 mg/dL Final   LDL Cholesterol (Calc) 07/09/2024 72  mg/dL (calc) Final   Comment: Reference range: <100 . Desirable range <100 mg/dL for primary prevention;    <70 mg/dL for patients with CHD or diabetic patients  with > or = 2 CHD risk factors. SABRA LDL-C is now calculated using the Martin-Hopkins  calculation, which is a validated novel method providing  better accuracy than the Friedewald equation in the  estimation of LDL-C.  Gladis APPLETHWAITE et al. SANDREA. 7986;689(80): 2061-2068  (http://education.[facility].com/faq/FAQ164)    Total CHOL/HDL Ratio 07/09/2024 2.5  <4.9 (calc) Final   Non-HDL Cholesterol (Calc) 07/09/2024 89  <130 mg/dL (calc) Final   Comment: For patients with diabetes plus 1 major ASCVD risk  factor, treating to a non-HDL-C goal of <100 mg/dL  (LDL-C of <29 mg/dL) is considered a therapeutic  option.    Hgb A1c MFr Bld 07/09/2024 5.5  <5.7 % Final   Comment: For the purpose of screening for the presence of diabetes: . <5.7%       Consistent with the absence of diabetes 5.7-6.4%    Consistent with increased risk for diabetes             (prediabetes) > or =6.5%  Consistent with diabetes . This assay result is consistent with a decreased risk of diabetes. . Currently, no consensus exists regarding use of hemoglobin A1c for diagnosis of diabetes in children. . According to American Diabetes Association (ADA) guidelines, hemoglobin A1c <7.0% represents optimal control in non-pregnant diabetic patients. Different metrics may apply to specific patient populations.  Standards of Medical Care in Diabetes(ADA). .    Mean Plasma Glucose 07/09/2024 111  mg/dL Final   eAG (mmol/L) 89/85/7974 6.2  mmol/L Final   Creatinine, Urine 07/09/2024 66  20 - 320 mg/dL Final   Microalb, Ur 89/85/7974 0.4  mg/dL Final   Comment: Reference Range Not established    Microalb Creat Ratio 07/09/2024 6  <30 mg/g creat Final   Comment: . The ADA defines abnormalities in albumin excretion as follows: SABRA Albuminuria Category        Result (mg/g creatinine) . Normal to Mildly increased   <30 Moderately increased         30-299   Severely increased           > OR = 300 . The ADA recommends that at least two of three specimens collected within a 3-6 month period be abnormal before considering a patient to be within a diagnostic category.   Procedure visit on 07/08/2024  Component Date Value  Ref Range Status   SURGICAL PATHOLOGY 07/08/2024    Final-Edited                   Value:SURGICAL PATHOLOGY [facility] [address] [place], [place] [address] Telephone [phone] or [phone] Fax [phone]  REPORT OF SURGICAL PATHOLOGY   Accession #: [id number] Patient Name: ROMERO, LETIZIA Visit # : [id number]  MRN: [id number] Physician: Avram Pitts DOB/Age [birth year] (Age: 81) Gender: M Collected Date: 07/08/2024 Received Date: 07/09/2024  FINAL DIAGNOSIS       1. Surgical [P], colon, cecum, polyp (1) :       TUBULAR ADENOMA (1) WITHOUT HIGH GRADE DYSPLASIA.       DATE SIGNED OUT: 07/10/2024 ELECTRONIC SIGNATURE : Belvie Come, John, Pathologist, Electronic Signature  MICROSCOPIC DESCRIPTION  CASE COMMENTS STAINS USED IN DIAGNOSIS: H&E    CLINICAL HISTORY  SPECIMEN(S) OBTAINED 1. Surgical [P], Colon, Cecum, Polyp (1)  SPECIMEN COMMENTS: 1. History of colonic polyps; benign neoplasm of cecum SPECIMEN CLINICAL INFORMATION: 1. R/O adenoma    Gross Description 1. Received in formal                         in are tan, soft tissue fragments that are submitted in toto. Number: 1 Size: 0.5 cm, (1B) ( TA )        Report signed out from the following location(s) [place]. [facility] [address] [place], [place] [address] CLIA #: [id number]  [facility] [address] [place], [place] [address] CLIA #: [id number]            Past Medical History:  Diagnosis Date   Allergy    occ- mild   Arthritis    knees   Diabetes (HCC)    Hyperlipidemia    Hypertension    Personal history of colonic polyps-adenomas  10/15/2012   Sleep apnea    Past Surgical History:  Procedure Laterality Date   COLONOSCOPY  10/15/2012   Procedure: COLONOSCOPY;  Surgeon: Pitts FORBES Avram, MD;  Location: [facility];  Service: Endoscopy;  Laterality: N/A;   COLONOSCOPY     Family history of colon cancer     KNEE ARTHROSCOPY     bilateral; twice on each knee   KNEE ARTHROTOMY  1976   right   POLYPECTOMY     UMBILICAL HERNIA REPAIR     x 2   Current Outpatient Medications on File Prior to Visit  Medication Sig Dispense Refill   aspirin 81 MG tablet Take 81 mg by mouth daily.     atorvastatin  (LIPITOR) 20 MG tablet TAKE 1 TABLET BY MOUTH ONCE  DAILY 90 tablet 3   doxazosin  (CARDURA ) 4 MG tablet TAKE 1 TABLET BY MOUTH DAILY 90 tablet 3   furosemide  (LASIX ) 40 MG tablet TAKE 1 TABLET BY MOUTH TWICE  DAILY 180 tablet 3   Green Tea, Camillia sinensis, (GREEN TEA PO) Take by mouth. Takes 2 green tea capsules daily     JARDIANCE  25 MG TABS tablet TAKE 1 TABLET BY MOUTH DAILY  BEFORE BREAKFAST 90 tablet 3   KRILL OIL PO Take by mouth daily.     losartan  (COZAAR ) 50 MG tablet TAKE 1 TABLET BY MOUTH DAILY 90 tablet 3   tirzepatide  (MOUNJARO ) 15 MG/0.5ML Pen INJECT 15MG  INTO THE SKIN ONE WEEKLY 6 mL 3   Turmeric (QC TUMERIC COMPLEX PO) Take by mouth daily at  12 noon.     Current Facility-Administered Medications on File Prior to Visit  Medication Dose Route Frequency Provider Last Rate Last Admin   0.9 %  sodium chloride  infusion  500 mL Intravenous Once Avram Lupita BRAVO, MD       No Known Allergies Social History   Socioeconomic History   Marital status: Married    Spouse name: Not on file   Number of children: Not on file   Years of education: Not on file   Highest education level: 12th grade  Occupational History   Not on file  Tobacco Use   Smoking status: Never   Smokeless tobacco: Never  Vaping Use   Vaping status: Never Used  Substance and Sexual Activity   Alcohol use: Not Currently    Alcohol/week:  30.0 standard drinks of alcohol    Types: 30 Cans of beer per week   Drug use: No   Sexual activity: Yes    Comment: married  Other Topics Concern   Not on file  Social History Narrative   Not on file   Social Drivers of Health   Financial Resource Strain: Low Risk  (07/08/2024)   Overall Financial Resource Strain (CARDIA)    Difficulty of Paying Living Expenses: Not hard at all  Food Insecurity: No Food Insecurity (07/08/2024)   Hunger Vital Sign    Worried About Running Out of Food in the Last Year: Never true    Ran Out of Food in the Last Year: Never true  Transportation Needs: No Transportation Needs (07/08/2024)   PRAPARE - Administrator, Civil Service (Medical): No    Lack of Transportation (Non-Medical): No  Physical Activity: Inactive (07/08/2024)   Exercise Vital Sign    Days of Exercise per Week: 0 days    Minutes of Exercise per Session: Not on file  Stress: No Stress Concern Present (07/08/2024)   [employer] of Occupational Health - Occupational Stress Questionnaire    Feeling of Stress: Only a little  Social Connections: Moderately Integrated (07/08/2024)   Social Connection and Isolation Panel    Frequency of Communication with Friends and Family: More than three times a week    Frequency of Social Gatherings with Friends and Family: More than three times a week    Attends Religious Services: 1 to 4 times per year    Active Member of [employer] or Organizations: No    Attends Engineer, Structural: Not on file    Marital Status: Married  Catering Manager Violence: Not on file   Family History  Problem Relation Age of Onset   Colon cancer Mother    Cancer Mother        breast   Breast cancer Mother    Cancer Father        not sure primary source    Sleep apnea Father    Stomach cancer Neg Hx    Colon polyps Neg Hx    Esophageal cancer Neg Hx    Rectal cancer Neg Hx       Review of Systems  All other systems reviewed and are  negative.      Objective:   Physical Exam Vitals reviewed.  Constitutional:      General: He is not in acute distress.    Appearance: Normal appearance. He is well-developed. He is obese. He is not ill-appearing, toxic-appearing or diaphoretic.  HENT:     Head: Normocephalic and atraumatic.     Right Ear:  Tympanic membrane, ear canal and external ear normal.     Left Ear: Tympanic membrane, ear canal and external ear normal.     Nose: Nose normal. No congestion or rhinorrhea.     Mouth/Throat:     Pharynx: No oropharyngeal exudate.  Eyes:     General: No scleral icterus.       Right eye: No discharge.        Left eye: No discharge.     Conjunctiva/sclera: Conjunctivae normal.     Pupils: Pupils are equal, round, and reactive to light.  Neck:     Thyroid: No thyromegaly.     Vascular: No carotid bruit or JVD.     Trachea: No tracheal deviation.  Cardiovascular:     Rate and Rhythm: Normal rate and regular rhythm.     Pulses: Normal pulses.     Heart sounds: Normal heart sounds. No murmur heard.    No friction rub. No gallop.  Pulmonary:     Effort: Pulmonary effort is normal. No respiratory distress.     Breath sounds: Normal breath sounds. No stridor. No wheezing, rhonchi or rales.  Chest:     Chest wall: No tenderness.  Abdominal:     General: Bowel sounds are normal. There is no distension.     Palpations: Abdomen is soft. There is no mass.     Tenderness: There is no abdominal tenderness. There is no guarding or rebound.  Musculoskeletal:     Cervical back: Neck supple. No rigidity or tenderness.     Right lower leg: Edema present.     Left lower leg: Edema present.  Lymphadenopathy:     Cervical: No cervical adenopathy.  Skin:    General: Skin is warm.     Coloration: Skin is not jaundiced or pale.     Findings: No bruising, erythema, lesion or rash.  Neurological:     General: No focal deficit present.     Mental Status: He is alert and oriented to person,  place, and time. Mental status is at baseline.     Cranial Nerves: No cranial nerve deficit.     Sensory: No sensory deficit.     Motor: No weakness or abnormal muscle tone.     Coordination: Coordination normal.     Gait: Gait normal.     Deep Tendon Reflexes: Reflexes are normal and symmetric. Reflexes normal.  Psychiatric:        Behavior: Behavior normal.        Thought Content: Thought content normal.        Judgment: Judgment normal.           Assessment & Plan:  Prostate cancer screening - Plan: CBC with Differential/Platelet, PSA  Hypertension, unspecified type - Plan: CBC with Differential/Platelet  Diastolic dysfunction  Morbid obesity (HCC)  Routine general medical examination at a health care facility Recommended the patient monitor his blood pressure at home and if his blood pressure stays less than 120 systolic, would recommend decreasing doxazosin  to 2 mg to avoid hypotension.  Offered the patient a flu shot which she declined.  Recommended RSV and COVID.  Colonoscopy is up-to-date.  Check PSA.  Also recheck hemoglobin.  Hemoglobin was recently elevated but I attribute that to hypoxia.  For the last month the patient has been wearing his CPAP more consistently.  Hopefully his hemoglobin will be improving

## 2024-08-06 ENCOUNTER — Ambulatory Visit: Payer: Self-pay | Admitting: Family Medicine

## 2024-08-06 LAB — CBC WITH DIFFERENTIAL/PLATELET
Absolute Lymphocytes: 1814 {cells}/uL (ref 850–3900)
Absolute Monocytes: 1116 {cells}/uL — ABNORMAL HIGH (ref 200–950)
Basophils Absolute: 47 {cells}/uL (ref 0–200)
Basophils Relative: 0.5 %
Eosinophils Absolute: 37 {cells}/uL (ref 15–500)
Eosinophils Relative: 0.4 %
HCT: 44.7 % (ref 38.5–50.0)
Hemoglobin: 15.3 g/dL (ref 13.2–17.1)
MCH: 30.1 pg (ref 27.0–33.0)
MCHC: 34.2 g/dL (ref 32.0–36.0)
MCV: 88 fL (ref 80.0–100.0)
MPV: 12.3 fL (ref 7.5–12.5)
Monocytes Relative: 12 %
Neutro Abs: 6287 {cells}/uL (ref 1500–7800)
Neutrophils Relative %: 67.6 %
Platelets: 279 Thousand/uL (ref 140–400)
RBC: 5.08 Million/uL (ref 4.20–5.80)
RDW: 11.8 % (ref 11.0–15.0)
Total Lymphocyte: 19.5 %
WBC: 9.3 Thousand/uL (ref 3.8–10.8)

## 2024-08-06 LAB — PSA: PSA: 2.95 ng/mL (ref <=4.00)

## 2024-08-08 ENCOUNTER — Ambulatory Visit: Payer: Self-pay

## 2024-08-08 NOTE — Telephone Encounter (Signed)
 FYI Only or Action Required?: Action required by provider: update on patient condition.  Patient was last seen in primary care on 08/05/2024 by Duanne Butler DASEN, MD.  Called Nurse Triage reporting Hypotension.  Symptoms began yesterday.  Interventions attempted: Prescription medications: doxazosin  (CARDURA ) 4 MG.  Symptoms are: unchanged.  Triage Disposition: See Physician Within 24 Hours  Patient/caregiver understands and will follow disposition?: No, wishes to speak with PCP          Copied from CRM #[id number]. Topic: Clinical - Red Word Triage >> Aug 08, 2024  3:25 PM Jasmin G wrote: Red Word that prompted transfer to Nurse Triage: Pt states that he was recently told by Dr. Duanne to contact the clinic if his bp got low, pt states that around 1:00 p.m it was 98/61 and it hasn't gotten over 102/65 since the last time he took it. Reason for Disposition  [1] Systolic BP 90-110 AND [2] taking blood pressure medications AND [3] NOT feeling weak or lightheaded    Triager offered to schedule with alternate provider, pt declines and only wishes to see PCP.   Pt reports that PCP had plans to modify dosage of doxazosin  (CARDURA ) 4 MG if pt continued to have lower BPs-- pt would like PCP to adjust. Triager will forward encounter for Dr Duanne 's office to review and advise. Patient verbalized understanding and is expecting call back from office.  Answer Assessment - Initial Assessment Questions 1. BLOOD PRESSURE: What is your blood pressure? Did you take at least two measurements 5 minutes apart?     Yesterday 1900 166/66 0900 116/71 1300 110/71 1515 98/61 1520 107/66 1525 102/65 1535 111/76 2. ONSET: When did you take your blood pressure?     Last night  3. HOW: How did you take your blood pressure? (e.g., visiting nurse, automatic home BP monitor)     Auto around wrist 4. HISTORY: Do you have a history of low blood pressure? What is your blood pressure normally?      HTN, typically 120s SBP 5. MEDICINES: Are you taking any medicines for blood pressure? If Yes, ask: Have they been changed recently?     doxazosin  (CARDURA ) 4 MG JARDIANCE  25 MG TABS tablet   6. PULSE RATE: Do you know what your pulse rate is?      unknown 7. OTHER SYMPTOMS: Have you been sick recently? Have you had a recent injury?     denies 8. PREGNANCY: Is there any chance you are pregnant? When was your last menstrual period?     N/a  Protocols used: Blood Pressure - Low-A-AH

## 2024-08-16 ENCOUNTER — Other Ambulatory Visit (HOSPITAL_COMMUNITY): Payer: Self-pay

## 2024-08-16 ENCOUNTER — Telehealth: Payer: Self-pay | Admitting: Pharmacy Technician

## 2024-08-16 NOTE — Telephone Encounter (Signed)
 Pharmacy Patient Advocate Encounter  Received notification from [facility] that Prior Authorization for Mounjaro  15MG /0.5ML auto-injectors has been APPROVED from 08/16/2024 to 09/25/2025. Unable to obtain price due to refill too soon rejection, last fill date 08/16/2024 next available fill date12/08/2024.   PA #/Case ID/Reference #: [id number]

## 2024-08-16 NOTE — Telephone Encounter (Signed)
 Pharmacy Patient Advocate Encounter   Received notification from [employer]base that prior authorization for Mounjaro  15MG /0.5ML auto-injectors is required/requested.   Insurance verification completed.   The patient is insured through [employer].   Per test claim: PA required; PA submitted to above mentioned insurance via Latent Key/confirmation #/EOC [id number] Status is pending

## 2024-08-19 ENCOUNTER — Telehealth: Payer: Self-pay | Admitting: Family Medicine

## 2024-08-19 NOTE — Telephone Encounter (Signed)
 Patient came to the office to drop off his blood pressure log; stated his blood pressure dropped really low and he stopped taking the medication.Patient asked if he should resume taking it; requesting call back. Blood pressure log placed on desk of provider's nurse.   Please advise at [phone].

## 2024-09-20 ENCOUNTER — Ambulatory Visit

## 2024-09-23 ENCOUNTER — Ambulatory Visit: Admitting: Physical Therapy

## 2024-09-27 ENCOUNTER — Ambulatory Visit: Admitting: Physical Therapy

## 2024-09-27 ENCOUNTER — Ambulatory Visit: Attending: Family Medicine

## 2024-09-27 ENCOUNTER — Other Ambulatory Visit: Payer: Self-pay

## 2024-09-27 DIAGNOSIS — M6281 Muscle weakness (generalized): Secondary | ICD-10-CM | POA: Diagnosis present

## 2024-09-27 DIAGNOSIS — Z96651 Presence of right artificial knee joint: Secondary | ICD-10-CM | POA: Diagnosis present

## 2024-09-27 DIAGNOSIS — M25561 Pain in right knee: Secondary | ICD-10-CM | POA: Insufficient documentation

## 2024-09-27 NOTE — Therapy (Signed)
 " OUTPATIENT PHYSICAL THERAPY LOWER EXTREMITY EVALUATION   Patient Name: Christopher Spence MRN: [id number] DOB:20-May-1961, 64 y.o., male Today's Date: 09/27/2024  END OF SESSION:  PT End of Session - 09/27/24 1026     Visit Number 1    Number of Visits 24    Date for Recertification  12/26/24    Authorization Type [employer] Medicare    PT Start Time 0930    PT Stop Time 1010    PT Time Calculation (min) 40 min    Activity Tolerance Patient tolerated treatment well          Past Medical History:  Diagnosis Date   Allergy    occ- mild   Arthritis    knees   Diabetes (HCC)    Hyperlipidemia    Hypertension    Personal history of colonic polyps-adenomas 10/15/2012   Sleep apnea    Past Surgical History:  Procedure Laterality Date   COLONOSCOPY  10/15/2012   Procedure: COLONOSCOPY;  Surgeon: Lupita FORBES Commander, MD;  Location: [facility];  Service: Endoscopy;  Laterality: N/A;   COLONOSCOPY     Family history of colon cancer     KNEE ARTHROSCOPY     bilateral; twice on each knee   KNEE ARTHROTOMY  1976   right   POLYPECTOMY     UMBILICAL HERNIA REPAIR     x 2   Patient Active Problem List   Diagnosis Date Noted   Arthralgia of left knee 07/08/2024   Dupuytren's disease of palm of left hand 01/22/2021   Chronic pain of both knees 01/29/2016   Swimmer's ear 04/22/2013   Hypertension    Hyperlipidemia    Diabetes (HCC)    History of colonic polyps 10/15/2012    PCP: Duanne Butler DASEN, MD Ref Provider (PCP)   REFERRING PROVIDER: Rubie Kemps, MD  REFERRING DIAG: M17.11 (ICD-10-CM) - Unilateral primary osteoarthritis, right knee  THERAPY DIAG:  Status post total right knee replacement  Muscle weakness (generalized)  Acute pain of right knee  Rationale for Evaluation and Treatment: rehabilitation  ONSET DATE: 09/16/24  SUBJECTIVE:   SUBJECTIVE STATEMENT: Has been using CPM machine consistently alongside doing HEP.   PERTINENT HISTORY: N/a PAIN:  6W,  0C Are you having pain? Yes: NPRS scale: 10 Pain location: R knee Pain description:   Aggravating factors: trying to  Relieving factors: medications  PRECAUTIONS: None  RED FLAGS: None   WEIGHT BEARING RESTRICTIONS: No  FALLS:  Has patient fallen in last 6 months? Yes. Number of falls 1  OCCUPATION: n/a  PLOF: Independent  PATIENT GOALS: improve strength, stairs, light jog if possible  OBJECTIVE:  Note: Objective measures were completed at Evaluation unless otherwise noted.   PATIENT SURVEYS:  LEFS  Extreme difficulty/unable (0), Quite a bit of difficulty (1), Moderate difficulty (2), Little difficulty (3), No difficulty (4) Survey date:    Any of your usual work, housework or school activities   2. Usual hobbies, recreational or sporting activities   3. Getting into/out of the bath   4. Walking between rooms   5. Putting on socks/shoes   6. Squatting    7. Lifting an object, like a bag of groceries from the floor   8. Performing light activities around your home   9. Performing heavy activities around your home   10. Getting into/out of a car   11. Walking 2 blocks   12. Walking 1 mile   13. Going up/down 10 stairs (1 flight)  14. Standing for 1 hour   15.  sitting for 1 hour   16. Running on even ground   17. Running on uneven ground   18. Making sharp turns while running fast   19. Hopping    20. Rolling over in bed   Score total:  41     COGNITION: Overall cognitive status: Within functional limits for tasks assessed     SENSATION: WFL   POSTURE: rounded shoulders and forward head  PALPATION: TTP R knee  LOWER EXTREMITY ROM:  Active ROM Right eval Left eval  Hip flexion    Hip extension    Hip abduction    Hip adduction    Hip internal rotation    Hip external rotation    Knee flexion 90 100  Knee extension -15 -10  Ankle dorsiflexion    Ankle plantarflexion    Ankle inversion    Ankle eversion     (Blank rows = not  tested)  LOWER EXTREMITY MMT:  MMT Right eval Left eval  Hip flexion 4- 4-  Hip extension    Hip abduction 3+ 3+  Hip adduction 3+ 3+  Hip internal rotation    Hip external rotation    Knee flexion 4 4+  Knee extension 3+! 4+  Ankle dorsiflexion    Ankle plantarflexion    Ankle inversion    Ankle eversion     (Blank rows = not tested)      GAIT: Distance walked: 30 Assistive device utilized: None Level of assistance: Complete Independence Comments: very slight limping gait on R side                                                                                                                                TREATMENT DATE:   TREATMENT 09/27/2024:  Therapeutic Exercise: Quadset x8x3s SLR x8x3s Heel slide with cord x8x3s    Self-care/Home Management: Patient educated on HEP, POC, prognosis, and relevant tissues/anatomy.     PATIENT EDUCATION:  Education details: HEP Person educated: Patient Education method: Programmer, Multimedia, Facilities Manager, and Handouts Education comprehension: verbalized understanding and returned demonstration  HOME EXERCISE PROGRAM: Access Code: [email or web address] URL: [email or web address] Date: 09/27/2024 Prepared by: Washington Scot  Exercises - Supine Straight Leg Raises  - 2-3 x daily - 5 x weekly - 2 sets - 8 reps - 3 hold - Supine Quadricep Sets  - 2-3 x daily - 5 x weekly - 2 sets - 8 reps - 3 hold - Supine Heel Slide with Strap  - 2-3 x daily - 5 x weekly - 2 sets - 8 reps - 3 hold  ASSESSMENT:  CLINICAL IMPRESSION: EVAL: Patient is a 64 year old male who presents with s/p R TKA on 09/16/24. Patient presents with deficits in: BL hip strength, excessive pain, and ROM. As a result, the patient would benefit from skilled PT to address aforementioned deficits via plan below.   OBJECTIVE IMPAIRMENTS: decreased ROM  and decreased strength.   ACTIVITY LIMITATIONS: standing, squatting, and stairs  PERSONAL FACTORS: Age are also  affecting patient's functional outcome.   REHAB POTENTIAL: Good  CLINICAL DECISION MAKING: Evolving/moderate complexity  EVALUATION COMPLEXITY: Moderate   GOALS: Goals reviewed with patient? No  SHORT TERM GOALS: Target date: 10/18/2024   1) Patient will demonstrate 75% HEP compliance to show independence with self-management of condition   Baseline: 0% Goal status: INITIAL  2) Patient will decrease worst pain/discomfort to 4 at most to improve ADL completion and overall QOL   Baseline: 6 Goal status: INITIAL    LONG TERM GOALS: Target date: 12/26/24   1) Patient will demonstrate 100% HEP compliance to show independence with self-management of condition   Baseline: 0% Goal status: INITIAL  2) Patient will decrease worst pain to 2 at most to improve ADL completion and overall QOL   Baseline: 6 Goal status: INITIAL  3) Patient will demonstrate a 10 point improvement in LEFS to show improvements in ADL completion and overall QOL    Baseline: 41 Goal status: INITIAL  4) Patient will demonstrate at least 4/5 MMT score of R knee and glutes to show improvements in muscle strength needed for ADL completion.     Baseline: see chart Goal status: INITIAL  5) Patient will demonstrate ability to ambulate 10 stairs with no significant increases in pain to demonstrate functional strength for community ambulation.    Baseline: unable Goal status: INITIAL    PLAN:  PT FREQUENCY: 1-2x/week  PT DURATION: 12 weeks  PLANNED INTERVENTIONS: 97110-Therapeutic exercises, 97530- Therapeutic activity, [id number]- Neuromuscular re-education, 97535- Self Care, [id number]- Manual therapy, [phone]- Gait training, Patient/Family education, Balance training, and Stair training  PLAN FOR NEXT SESSION: HEP assessment and progression, symptom modulation, and loading (isolated and/or functional). Manual therapy, aerobic, gait, and NME training as needed. Improve R knee ROM via loading as  tolerated.    Washington Greener Bernell Haynie  PT, DPT  09/27/2024, 10:41 AM  Date of referral: 08/14/24 Referring provider: Rubie Kemps, MD Referring diagnosis? M17.11 (ICD-10-CM) - Unilateral primary osteoarthritis, right knee Treatment diagnosis? (if different than referring diagnosis) Status post total right knee replacement  Muscle weakness (generalized)  Acute pain of right knee  What was this (referring dx) caused by? Surgery (Type: R TKA)  Nature of Condition: Initial Onset (within last 3 months)   Laterality: Rt  Current Functional Measure Score: LEFS 41  Objective measurements identify impairments when they are compared to normal values, the uninvolved extremity, and prior level of function.  [x]  Yes  []  No  Objective assessment of functional ability: Moderate functional limitations   Briefly describe symptoms: discomfort/ache when trying to sleep, sharp pain with some movements  How did symptoms start: n/a  Average pain intensity:  Last 24 hours: 6  Past week: 6  How often does the pt experience symptoms? Occasionally  How much have the symptoms interfered with usual daily activities? Moderately  How has condition changed since care began at this facility? No change  In general, how is the patients overall health? Good   BACK PAIN (STarT Back Screening Tool) No   "

## 2024-09-30 NOTE — Therapy (Addendum)
 " OUTPATIENT PHYSICAL THERAPY LOWER EXTREMITY TREATMENT   Patient Name: Christopher Spence MRN: [id number] DOB:Jan 29, 1961, 64 y.o., male Today's Date: 10/01/2024  END OF SESSION:  PT End of Session - 10/01/24 0940     Visit Number 2    Number of Visits 24    Date for Recertification  12/26/24    Authorization Type [employer] Medicare    PT Start Time 0930    PT Stop Time 1015    PT Time Calculation (min) 45 min    Activity Tolerance Patient tolerated treatment well    Behavior During Therapy [facility] for tasks assessed/performed           Past Medical History:  Diagnosis Date   Allergy    occ- mild   Arthritis    knees   Diabetes (HCC)    Hyperlipidemia    Hypertension    Personal history of colonic polyps-adenomas 10/15/2012   Sleep apnea    Past Surgical History:  Procedure Laterality Date   COLONOSCOPY  10/15/2012   Procedure: COLONOSCOPY;  Surgeon: Lupita FORBES Commander, MD;  Location: [facility];  Service: Endoscopy;  Laterality: N/A;   COLONOSCOPY     Family history of colon cancer     KNEE ARTHROSCOPY     bilateral; twice on each knee   KNEE ARTHROTOMY  1976   right   POLYPECTOMY     UMBILICAL HERNIA REPAIR     x 2   Patient Active Problem List   Diagnosis Date Noted   Arthralgia of left knee 07/08/2024   Dupuytren's disease of palm of left hand 01/22/2021   Chronic pain of both knees 01/29/2016   Swimmer's ear 04/22/2013   Hypertension    Hyperlipidemia    Diabetes (HCC)    History of colonic polyps 10/15/2012    PCP: Duanne Butler DASEN, MD Ref Provider (PCP)   REFERRING PROVIDER: Rubie Kemps, MD  REFERRING DIAG: M17.11 (ICD-10-CM) - Unilateral primary osteoarthritis, right knee  THERAPY DIAG:  Status post total right knee replacement  Muscle weakness (generalized)  Acute pain of right knee  Rationale for Evaluation and Treatment: rehabilitation  ONSET DATE: 09/16/24  SUBJECTIVE:   SUBJECTIVE STATEMENT: Pt reports he is doing well. Completing his  HEP consistently.  EVAL: Has been using CPM machine consistently alongside doing HEP.   PERTINENT HISTORY: N/a PAIN:  6W, 0C Are you having pain? Yes: NPRS scale: 10 Pain location: R knee Pain description:   Aggravating factors: trying to  Relieving factors: medications  PRECAUTIONS: None  RED FLAGS: None   WEIGHT BEARING RESTRICTIONS: No  FALLS:  Has patient fallen in last 6 months? Yes. Number of falls 1  OCCUPATION: n/a  PLOF: Independent  PATIENT GOALS: improve strength, stairs, light jog if possible  OBJECTIVE:  Note: Objective measures were completed at Evaluation unless otherwise noted.   PATIENT SURVEYS:  LEFS  Extreme difficulty/unable (0), Quite a bit of difficulty (1), Moderate difficulty (2), Little difficulty (3), No difficulty (4) Survey date:    Any of your usual work, housework or school activities   2. Usual hobbies, recreational or sporting activities   3. Getting into/out of the bath   4. Walking between rooms   5. Putting on socks/shoes   6. Squatting    7. Lifting an object, like a bag of groceries from the floor   8. Performing light activities around your home   9. Performing heavy activities around your home   10. Getting into/out of a  car   11. Walking 2 blocks   12. Walking 1 mile   13. Going up/down 10 stairs (1 flight)   14. Standing for 1 hour   15.  sitting for 1 hour   16. Running on even ground   17. Running on uneven ground   18. Making sharp turns while running fast   19. Hopping    20. Rolling over in bed   Score total:  41     COGNITION: Overall cognitive status: Within functional limits for tasks assessed     SENSATION: WFL   POSTURE: rounded shoulders and forward head  PALPATION: TTP R knee  LOWER EXTREMITY ROM:  Active ROM Right eval Left eval RT 10/01/24  Hip flexion     Hip extension     Hip abduction     Hip adduction     Hip internal rotation     Hip external rotation     Knee flexion 90 100  100  Knee extension -15 -10 -5  Ankle dorsiflexion     Ankle plantarflexion     Ankle inversion     Ankle eversion      (Blank rows = not tested)  LOWER EXTREMITY MMT:  MMT Right eval Left eval  Hip flexion 4- 4-  Hip extension    Hip abduction 3+ 3+  Hip adduction 3+ 3+  Hip internal rotation    Hip external rotation    Knee flexion 4 4+  Knee extension 3+! 4+  Ankle dorsiflexion    Ankle plantarflexion    Ankle inversion    Ankle eversion     (Blank rows = not tested)      GAIT: Distance walked: 30 Assistive device utilized: None Level of assistance: Complete Independence Comments: very slight limping gait on R side                                                                                                                                TREATMENT DATE:  [facility] Adult PT Treatment:                                                DATE: 10/01/24 Therapeutic Exercise: Nustep L4 UE/LE c progressive flexion QS x10 5 HS x10 3 HS c strap x5 30 SLR c QS 2x10 S/L hip abd 2x10 S/L clams 2x10 Bridging 2x10 SL balance with hand assist x3 20  TREATMENT 09/27/2024:  Therapeutic Exercise: Quadset x8x3s SLR x8x3s Heel slide with cord x8x3s    Self-care/Home Management: Patient educated on HEP, POC, prognosis, and relevant tissues/anatomy.     PATIENT EDUCATION:  Education details: HEP Person educated: Patient Education method: Programmer, Multimedia, Facilities Manager, and Handouts Education comprehension: verbalized understanding and returned demonstration  HOME EXERCISE PROGRAM: Access Code: [email or web address] URL: [email or web address] Date: 10/01/2024  Prepared by: Dasie Daft  Exercises - Supine Straight Leg Raises  - 2 x daily - 5 x weekly - 2 sets - 8 reps - 3 hold - Supine Quadricep Sets  - 2-3 x daily - 5 x weekly - 2 sets - 8 reps - 3 hold - Supine Heel Slide with Strap  - 2-3 x daily - 5 x weekly - 2 sets - 8 reps - 3 hold - Sidelying Hip  Abduction  - 2 x daily - 7 x weekly - 2 sets - 10 reps - 3 hold - Clamshell with Resistance  - 2 x daily - 7 x weekly - 2 sets - 10 reps - 3 hold - Supine Bridge  - 2 x daily - 7 x weekly - 2 sets - 10 reps - 3 hold - Standing Single Leg Stance with Counter Support  - 2 x daily - 7 x weekly - 1 sets - 3 reps - 2 hold  ASSESSMENT:  CLINICAL IMPRESSION: Pt is making goods gains with AROM, gait quality (heel to toe), and overall function. Pain is well controlled. Additional exs for knee and hip strengthening and balance were completed today and added to the pt's HEP. Pt tolerated PT today without adverse effects   EVAL: Patient is a 64 year old male who presents with s/p R TKA on 09/16/24. Patient presents with deficits in: BL hip strength, excessive pain, and ROM. As a result, the patient would benefit from skilled PT to address aforementioned deficits via plan below.   OBJECTIVE IMPAIRMENTS: decreased ROM and decreased strength.   ACTIVITY LIMITATIONS: standing, squatting, and stairs  PERSONAL FACTORS: Age are also affecting patient's functional outcome.   REHAB POTENTIAL: Good  CLINICAL DECISION MAKING: Evolving/moderate complexity  EVALUATION COMPLEXITY: Moderate   GOALS: Goals reviewed with patient? No  SHORT TERM GOALS: Target date: 10/18/2024   1) Patient will demonstrate 75% HEP compliance to show independence with self-management of condition   Baseline: 0% Goal status: INITIAL  2) Patient will decrease worst pain/discomfort to 4 at most to improve ADL completion and overall QOL   Baseline: 6 Goal status: INITIAL    LONG TERM GOALS: Target date: 12/26/24   1) Patient will demonstrate 100% HEP compliance to show independence with self-management of condition   Baseline: 0% Goal status: INITIAL  2) Patient will decrease worst pain to 2 at most to improve ADL completion and overall QOL   Baseline: 6 Goal status: INITIAL  3) Patient will demonstrate a 10  point improvement in LEFS to show improvements in ADL completion and overall QOL    Baseline: 41 Goal status: INITIAL  4) Patient will demonstrate at least 4/5 MMT score of R knee and glutes to show improvements in muscle strength needed for ADL completion.     Baseline: see chart Goal status: INITIAL  5) Patient will demonstrate ability to ambulate 10 stairs with no significant increases in pain to demonstrate functional strength for community ambulation.    Baseline: unable Goal status: INITIAL    PLAN:  PT FREQUENCY: 1-2x/week  PT DURATION: 12 weeks  PLANNED INTERVENTIONS: 97110-Therapeutic exercises, 97530- Therapeutic activity, [id number]- Neuromuscular re-education, 97535- Self Care, [id number]- Manual therapy, [phone]- Gait training, Patient/Family education, Balance training, and Stair training  PLAN FOR NEXT SESSION: HEP assessment and progression, symptom modulation, and loading (isolated and/or functional). Manual therapy, aerobic, gait, and NME training as needed. Improve R knee ROM via loading as tolerated.   Charita Lindenberger MS, PT 10/01/2024  3:14 PM     "

## 2024-10-01 ENCOUNTER — Ambulatory Visit

## 2024-10-01 DIAGNOSIS — M6281 Muscle weakness (generalized): Secondary | ICD-10-CM

## 2024-10-01 DIAGNOSIS — Z96651 Presence of right artificial knee joint: Secondary | ICD-10-CM | POA: Diagnosis not present

## 2024-10-01 DIAGNOSIS — M25561 Pain in right knee: Secondary | ICD-10-CM

## 2024-10-01 NOTE — Therapy (Signed)
 " OUTPATIENT PHYSICAL THERAPY LOWER EXTREMITY TREATMENT   Patient Name: Christopher Spence MRN: [id number] DOB:[birth year], 64 y.o., male Today's Date: 10/03/2024  END OF SESSION:  PT End of Session - 10/03/24 1027     Visit Number 3    Number of Visits 24    Date for Recertification  12/26/24    Authorization Type [employer] Medicare    PT Start Time 0933    PT Stop Time 1015    PT Time Calculation (min) 42 min    Activity Tolerance Patient tolerated treatment well            Past Medical History:  Diagnosis Date   Allergy    occ- mild   Arthritis    knees   Diabetes (HCC)    Hyperlipidemia    Hypertension    Personal history of colonic polyps-adenomas 10/15/2012   Sleep apnea    Past Surgical History:  Procedure Laterality Date   COLONOSCOPY  10/15/2012   Procedure: COLONOSCOPY;  Surgeon: Lupita FORBES Commander, MD;  Location: [facility];  Service: Endoscopy;  Laterality: N/A;   COLONOSCOPY     Family history of colon cancer     KNEE ARTHROSCOPY     bilateral; twice on each knee   KNEE ARTHROTOMY  1976   right   POLYPECTOMY     UMBILICAL HERNIA REPAIR     x 2   Patient Active Problem List   Diagnosis Date Noted   Arthralgia of left knee 07/08/2024   Dupuytren's disease of palm of left hand 01/22/2021   Chronic pain of both knees 01/29/2016   Swimmer's ear 04/22/2013   Hypertension    Hyperlipidemia    Diabetes (HCC)    History of colonic polyps 10/15/2012    PCP: Duanne Butler DASEN, MD Ref Provider (PCP)   REFERRING PROVIDER: Rubie Kemps, MD  REFERRING DIAG: M17.11 (ICD-10-CM) - Unilateral primary osteoarthritis, right knee  THERAPY DIAG:  Status post total right knee replacement  Muscle weakness (generalized)  Acute pain of right knee  Rationale for Evaluation and Treatment: rehabilitation  ONSET DATE: 09/16/24  SUBJECTIVE:   SUBJECTIVE STATEMENT: Pt reports his hamstring muscles will spasm with the bridging ex.   EVAL: Has been using CPM machine  consistently alongside doing HEP.   PERTINENT HISTORY: N/a PAIN:  6W, 0C Are you having pain? Yes: NPRS scale: 0/10 Pain location: R knee Pain description:   Aggravating factors: trying to  Relieving factors: medications  PRECAUTIONS: None  RED FLAGS: None   WEIGHT BEARING RESTRICTIONS: No  FALLS:  Has patient fallen in last 6 months? Yes. Number of falls 1  OCCUPATION: n/a  PLOF: Independent  PATIENT GOALS: improve strength, stairs, light jog if possible  OBJECTIVE:  Note: Objective measures were completed at Evaluation unless otherwise noted.   PATIENT SURVEYS:  LEFS  Extreme difficulty/unable (0), Quite a bit of difficulty (1), Moderate difficulty (2), Little difficulty (3), No difficulty (4) Survey date:    Any of your usual work, housework or school activities   2. Usual hobbies, recreational or sporting activities   3. Getting into/out of the bath   4. Walking between rooms   5. Putting on socks/shoes   6. Squatting    7. Lifting an object, like a bag of groceries from the floor   8. Performing light activities around your home   9. Performing heavy activities around your home   10. Getting into/out of a car   11. Walking 2 blocks  12. Walking 1 mile   13. Going up/down 10 stairs (1 flight)   14. Standing for 1 hour   15.  sitting for 1 hour   16. Running on even ground   17. Running on uneven ground   18. Making sharp turns while running fast   19. Hopping    20. Rolling over in bed   Score total:  41     COGNITION: Overall cognitive status: Within functional limits for tasks assessed     SENSATION: WFL   POSTURE: rounded shoulders and forward head  PALPATION: TTP R knee  LOWER EXTREMITY ROM:  Active ROM Right eval Left eval RT 10/01/24 RT 10/03/24  Hip flexion      Hip extension      Hip abduction      Hip adduction      Hip internal rotation      Hip external rotation      Knee flexion 90 100 100P 102A  Knee extension -15 -10  -5 -5  Ankle dorsiflexion      Ankle plantarflexion      Ankle inversion      Ankle eversion       (Blank rows = not tested)  LOWER EXTREMITY MMT:  MMT Right eval Left eval  Hip flexion 4- 4-  Hip extension    Hip abduction 3+ 3+  Hip adduction 3+ 3+  Hip internal rotation    Hip external rotation    Knee flexion 4 4+  Knee extension 3+! 4+  Ankle dorsiflexion    Ankle plantarflexion    Ankle inversion    Ankle eversion     (Blank rows = not tested)      GAIT: Distance walked: 30 Assistive device utilized: None Level of assistance: Complete Independence Comments: very slight limping gait on R side                                                                                                                                TREATMENT DATE:  [facility] Adult PT Treatment:                                                DATE: 10/03/24 Therapeutic Exercise: Supine heel slide to qs x10 Long sit hamstring/knee ext stretch c strap x3 3 Therapeutic Activity: Nustep L4 UE/LE c progressive flexion Lat step ups 4 c L hand A 2x10 Standing hip abd 2x12 4# Standing hip ext 2x12 4# Standing knee flex 2x12 4# Standing heel raise/toe lifts STS 2x10 from elevated  [facility] Adult PT Treatment:  DATE: 10/01/24 Therapeutic Exercise: Nustep L4 UE/LE c progressive flexion QS x10 5 HS x10 3 HS c strap x5 30 SLR c QS 2x10 S/L hip abd 2x10 S/L clams 2x10 Bridging 2x10 SL balance with hand assist x3 20  TREATMENT 09/27/2024:  Therapeutic Exercise: Quadset x8x3s SLR x8x3s Heel slide with cord x8x3s    Self-care/Home Management: Patient educated on HEP, POC, prognosis, and relevant tissues/anatomy.     PATIENT EDUCATION:  Education details: HEP Person educated: Patient Education method: Programmer, Multimedia, Facilities Manager, and Handouts Education comprehension: verbalized understanding and returned demonstration  HOME EXERCISE  PROGRAM: Access Code: [email or web address] URL: [email or web address] Date: 10/03/2024 Prepared by: Dasie Daft  Exercises - Supine Straight Leg Raises  - 2 x daily - 5 x weekly - 2 sets - 8 reps - 3 hold - Supine Quadricep Sets  - 2-3 x daily - 5 x weekly - 2 sets - 8 reps - 3 hold - Supine Heel Slide with Strap  - 2-3 x daily - 5 x weekly - 2 sets - 8 reps - 3 hold - Sidelying Hip Abduction  - 2 x daily - 7 x weekly - 2 sets - 10 reps - 3 hold - Clamshell with Resistance  - 2 x daily - 7 x weekly - 2 sets - 10 reps - 3 hold - Standing Single Leg Stance with Counter Support  - 2 x daily - 7 x weekly - 1 sets - 3 reps - 2 hold - Sit to Stand Without Arm Support  - 2 x daily - 7 x weekly - 2 sets - 10 reps - Seated Table Hamstring Stretch (Mirrored)  - 2 x daily - 7 x weekly - 1 sets - 3 reps - 30 hold  ASSESSMENT:  CLINICAL IMPRESSION: PT was completed for knee/LE strengthening c exs primarily completed in the [facility]. STS ex was started to substitute bridging c pt experiencing hamstring spasms c bridging. Pt tolerated prescribed exs without adverse effects. Pt is making good progress. Pt will continue to benefit from skilled PT to address impairments for improved R knee/LE function.    EVAL: Patient is a 64 year old male who presents with s/p R TKA on 09/16/24. Patient presents with deficits in: BL hip strength, excessive pain, and ROM. As a result, the patient would benefit from skilled PT to address aforementioned deficits via plan below.   OBJECTIVE IMPAIRMENTS: decreased ROM and decreased strength.   ACTIVITY LIMITATIONS: standing, squatting, and stairs  PERSONAL FACTORS: Age are also affecting patient's functional outcome.   REHAB POTENTIAL: Good  CLINICAL DECISION MAKING: Evolving/moderate complexity  EVALUATION COMPLEXITY: Moderate   GOALS: Goals reviewed with patient? No  SHORT TERM GOALS: Target date: 10/18/2024   1) Patient will demonstrate 75% HEP compliance to  show independence with self-management of condition   Baseline: 0% Goal status: INITIAL  2) Patient will decrease worst pain/discomfort to 4 at most to improve ADL completion and overall QOL   Baseline: 6 Goal status: INITIAL    LONG TERM GOALS: Target date: 12/26/24   1) Patient will demonstrate 100% HEP compliance to show independence with self-management of condition   Baseline: 0% Goal status: INITIAL  2) Patient will decrease worst pain to 2 at most to improve ADL completion and overall QOL   Baseline: 6 Goal status: INITIAL  3) Patient will demonstrate a 10 point improvement in LEFS to show improvements in ADL completion and overall QOL    Baseline: 41 Goal status: INITIAL  4) Patient will demonstrate at least 4/5 MMT score of R knee and glutes to show improvements in muscle strength needed for ADL completion.     Baseline: see chart Goal status: INITIAL  5) Patient will demonstrate ability to ambulate 10 stairs with no significant increases in pain to demonstrate functional strength for community ambulation.    Baseline: unable Goal status: INITIAL    PLAN:  PT FREQUENCY: 1-2x/week  PT DURATION: 12 weeks  PLANNED INTERVENTIONS: 97110-Therapeutic exercises, 97530- Therapeutic activity, [id number]- Neuromuscular re-education, 97535- Self Care, [id number]- Manual therapy, [phone]- Gait training, Patient/Family education, Balance training, and Stair training  PLAN FOR NEXT SESSION: HEP assessment and progression, symptom modulation, and loading (isolated and/or functional). Manual therapy, aerobic, gait, and NME training as needed. Improve R knee ROM via loading as tolerated.   Marisa Hufstetler MS, PT 10/03/2024 2:57 PM     "

## 2024-10-03 ENCOUNTER — Ambulatory Visit

## 2024-10-03 DIAGNOSIS — M25561 Pain in right knee: Secondary | ICD-10-CM

## 2024-10-03 DIAGNOSIS — Z96651 Presence of right artificial knee joint: Secondary | ICD-10-CM

## 2024-10-03 DIAGNOSIS — M6281 Muscle weakness (generalized): Secondary | ICD-10-CM

## 2024-10-07 ENCOUNTER — Ambulatory Visit

## 2024-10-07 DIAGNOSIS — M6281 Muscle weakness (generalized): Secondary | ICD-10-CM

## 2024-10-07 DIAGNOSIS — M25561 Pain in right knee: Secondary | ICD-10-CM

## 2024-10-07 DIAGNOSIS — Z96651 Presence of right artificial knee joint: Secondary | ICD-10-CM

## 2024-10-07 NOTE — Therapy (Signed)
 " OUTPATIENT PHYSICAL THERAPY LOWER EXTREMITY TREATMENT   Patient Name: Christopher Spence MRN: [id number] DOB:20-Dec-1960, 64 y.o., male Today's Date: 10/07/2024  END OF SESSION:  PT End of Session - 10/07/24 1015     Visit Number 4    Number of Visits 24    Date for Recertification  12/26/24    Authorization Type [employer] Medicare    PT Start Time 1015    PT Stop Time 1055    PT Time Calculation (min) 40 min    Activity Tolerance Patient tolerated treatment well            Past Medical History:  Diagnosis Date   Allergy    occ- mild   Arthritis    knees   Diabetes (HCC)    Hyperlipidemia    Hypertension    Personal history of colonic polyps-adenomas 10/15/2012   Sleep apnea    Past Surgical History:  Procedure Laterality Date   COLONOSCOPY  10/15/2012   Procedure: COLONOSCOPY;  Surgeon: Lupita FORBES Commander, MD;  Location: [facility];  Service: Endoscopy;  Laterality: N/A;   COLONOSCOPY     Family history of colon cancer     KNEE ARTHROSCOPY     bilateral; twice on each knee   KNEE ARTHROTOMY  1976   right   POLYPECTOMY     UMBILICAL HERNIA REPAIR     x 2   Patient Active Problem List   Diagnosis Date Noted   Arthralgia of left knee 07/08/2024   Dupuytren's disease of palm of left hand 01/22/2021   Chronic pain of both knees 01/29/2016   Swimmer's ear 04/22/2013   Hypertension    Hyperlipidemia    Diabetes (HCC)    History of colonic polyps 10/15/2012    PCP: Duanne Butler DASEN, MD Ref Provider (PCP)   REFERRING PROVIDER: Rubie Kemps, MD  REFERRING DIAG: M17.11 (ICD-10-CM) - Unilateral primary osteoarthritis, right knee  THERAPY DIAG:  Status post total right knee replacement  Muscle weakness (generalized)  Acute pain of right knee  Rationale for Evaluation and Treatment: rehabilitation  ONSET DATE: 09/16/24  SUBJECTIVE:   SUBJECTIVE STATEMENT: No pain today, still a little bit sore. Has been doing exercises. Was a little bit sore after last  session.  EVAL: Has been using CPM machine consistently alongside doing HEP.   PERTINENT HISTORY: N/a PAIN:  6W, 0C Are you having pain? Yes: NPRS scale: 0/10 Pain location: R knee Pain description:   Aggravating factors: trying to  Relieving factors: medications  PRECAUTIONS: None  RED FLAGS: None   WEIGHT BEARING RESTRICTIONS: No  FALLS:  Has patient fallen in last 6 months? Yes. Number of falls 1  OCCUPATION: n/a  PLOF: Independent  PATIENT GOALS: improve strength, stairs, light jog if possible  OBJECTIVE:  Note: Objective measures were completed at Evaluation unless otherwise noted.   PATIENT SURVEYS:  LEFS  Extreme difficulty/unable (0), Quite a bit of difficulty (1), Moderate difficulty (2), Little difficulty (3), No difficulty (4) Survey date:    Any of your usual work, housework or school activities   2. Usual hobbies, recreational or sporting activities   3. Getting into/out of the bath   4. Walking between rooms   5. Putting on socks/shoes   6. Squatting    7. Lifting an object, like a bag of groceries from the floor   8. Performing light activities around your home   9. Performing heavy activities around your home   10. Getting into/out of  a car   11. Walking 2 blocks   12. Walking 1 mile   13. Going up/down 10 stairs (1 flight)   14. Standing for 1 hour   15.  sitting for 1 hour   16. Running on even ground   17. Running on uneven ground   18. Making sharp turns while running fast   19. Hopping    20. Rolling over in bed   Score total:  41     COGNITION: Overall cognitive status: Within functional limits for tasks assessed     SENSATION: WFL   POSTURE: rounded shoulders and forward head  PALPATION: TTP R knee  LOWER EXTREMITY ROM:  Active ROM Right eval Left eval RT 10/01/24 RT 10/03/24  Hip flexion      Hip extension      Hip abduction      Hip adduction      Hip internal rotation      Hip external rotation      Knee  flexion 90 100 100P 102A  Knee extension -15 -10 -5 -5  Ankle dorsiflexion      Ankle plantarflexion      Ankle inversion      Ankle eversion       (Blank rows = not tested)  LOWER EXTREMITY MMT:  MMT Right eval Left eval  Hip flexion 4- 4-  Hip extension    Hip abduction 3+ 3+  Hip adduction 3+ 3+  Hip internal rotation    Hip external rotation    Knee flexion 4 4+  Knee extension 3+! 4+  Ankle dorsiflexion    Ankle plantarflexion    Ankle inversion    Ankle eversion     (Blank rows = not tested)      GAIT: Distance walked: 30 Assistive device utilized: None Level of assistance: Complete Independence Comments: very slight limping gait on R side                                                                                                                                TREATMENT DATE:  10/07/24 Therapeutic Exercise/Activity: HEP reassessment and update Seated LAQ 2x8x3s, 2x8x3s GTB Seated HS slide 2x8x3s, 2x8x3s GTB Seated hip march x8x3s RTB, x8x3s GTB Seated clamshell x8x3s RTB, x8x3s GTB, 2x8x3s Blue TB STS x8 6 inch step up with SUE x8     [facility] Adult PT Treatment:                                                DATE: 10/03/24 Therapeutic Exercise: Supine heel slide to qs x10 Long sit hamstring/knee ext stretch c strap x3 3 Therapeutic Activity: Nustep L4 UE/LE c progressive flexion Lat step ups 4 c L hand A 2x10 Standing hip abd 2x12 4# Standing hip ext 2x12  4# Standing knee flex 2x12 4# Standing heel raise/toe lifts STS 2x10 from elevated  [facility] Adult PT Treatment:                                                DATE: 10/01/24 Therapeutic Exercise: Nustep L4 UE/LE c progressive flexion QS x10 5 HS x10 3 HS c strap x5 30 SLR c QS 2x10 S/L hip abd 2x10 S/L clams 2x10 Bridging 2x10 SL balance with hand assist x3 20  TREATMENT 09/27/2024:  Therapeutic Exercise: Quadset x8x3s SLR x8x3s Heel slide with cord  x8x3s    Self-care/Home Management: Patient educated on HEP, POC, prognosis, and relevant tissues/anatomy.     PATIENT EDUCATION:  Education details: HEP Person educated: Patient Education method: Programmer, Multimedia, Facilities Manager, and Handouts Education comprehension: verbalized understanding and returned demonstration  HOME EXERCISE PROGRAM: Access Code: [email or web address] URL: [email or web address] Date: 10/07/2024 Prepared by: Washington Scot  Exercises - Supine Straight Leg Raises  - 2 x daily - 5 x weekly - 2 sets - 8 reps - 3 hold - Seated Knee Extension with Resistance  - 2 x daily - 5 x weekly - 2 sets - 8 reps - 3 hold - Seated Hamstring Curls with Resistance  - 2 x daily - 5 x weekly - 2 sets - 8 reps - 3 hold - Seated Hip Abduction with Resistance  - 2 x daily - 5 x weekly - 2 sets - 8 reps - 3 hold - Standing Single Leg Stance with Counter Support  - 2 x daily - 5 x weekly - 2 sets - 8 reps - 3 hold - Sit to Stand with Arms Crossed  - 2 x daily - 5 x weekly - 2 sets - 8 reps - 3 hold    ASSESSMENT:  CLINICAL IMPRESSION: Patient tolerated treatment with no increases in pain with progressions in RLE loading and functional strength. Current deficits include: functional activity tolerance, RLE strength, and excessive pain. As a result, patient would continue to benefit from skilled PT to address said deficits via plan below.    EVAL: Patient is a 64 year old male who presents with s/p R TKA on 09/16/24. Patient presents with deficits in: BL hip strength, excessive pain, and ROM. As a result, the patient would benefit from skilled PT to address aforementioned deficits via plan below.   OBJECTIVE IMPAIRMENTS: decreased ROM and decreased strength.   ACTIVITY LIMITATIONS: standing, squatting, and stairs  PERSONAL FACTORS: Age are also affecting patient's functional outcome.   REHAB POTENTIAL: Good  CLINICAL DECISION MAKING: Evolving/moderate complexity  EVALUATION  COMPLEXITY: Moderate   GOALS: Goals reviewed with patient? No  SHORT TERM GOALS: Target date: 10/18/2024   1) Patient will demonstrate 75% HEP compliance to show independence with self-management of condition   Baseline: 0% Goal status: INITIAL  2) Patient will decrease worst pain/discomfort to 4 at most to improve ADL completion and overall QOL   Baseline: 6 Goal status: INITIAL    LONG TERM GOALS: Target date: 12/26/24   1) Patient will demonstrate 100% HEP compliance to show independence with self-management of condition   Baseline: 0% Goal status: INITIAL  2) Patient will decrease worst pain to 2 at most to improve ADL completion and overall QOL   Baseline: 6 Goal status: INITIAL  3) Patient will demonstrate a 10 point improvement in  LEFS to show improvements in ADL completion and overall QOL    Baseline: 41 Goal status: INITIAL  4) Patient will demonstrate at least 4/5 MMT score of R knee and glutes to show improvements in muscle strength needed for ADL completion.     Baseline: see chart Goal status: INITIAL  5) Patient will demonstrate ability to ambulate 10 stairs with no significant increases in pain to demonstrate functional strength for community ambulation.    Baseline: unable Goal status: INITIAL    PLAN:  PT FREQUENCY: 1-2x/week  PT DURATION: 12 weeks  PLANNED INTERVENTIONS: 97110-Therapeutic exercises, 97530- Therapeutic activity, [id number]- Neuromuscular re-education, 97535- Self Care, 02859- Manual therapy, [phone]- Gait training, Patient/Family education, Balance training, and Stair training  PLAN FOR NEXT SESSION: HEP assessment and progression, symptom modulation, and loading (isolated and/or functional). Manual therapy, aerobic, gait, and NME training as needed. Improve R knee ROM via loading as tolerated.   Washington Odessia Scot  PT, DPT      "

## 2024-10-09 ENCOUNTER — Other Ambulatory Visit: Payer: Self-pay | Admitting: Family Medicine

## 2024-10-09 DIAGNOSIS — E119 Type 2 diabetes mellitus without complications: Secondary | ICD-10-CM

## 2024-10-09 DIAGNOSIS — E7800 Pure hypercholesterolemia, unspecified: Secondary | ICD-10-CM

## 2024-10-09 DIAGNOSIS — I10 Essential (primary) hypertension: Secondary | ICD-10-CM

## 2024-10-09 MED ORDER — MOUNJARO 15 MG/0.5ML ~~LOC~~ SOAJ: 15.0000 mg | SUBCUTANEOUS | 3 refills | Status: AC

## 2024-10-09 NOTE — Telephone Encounter (Signed)
 Copied from CRM [id number]. Topic: Clinical - Prescription Issue >> Oct 09, 2024  2:56 PM Wess RAMAN wrote: Reason for CRM: Patient's wife, Winton, stated tirzepatide  (MOUNJARO ) 15 MG/0.5ML Pen needs to be sent to [facility] #: [id number]  Pharmacy: [facility] - [place], [place] - [address] [place] [address] Phone: [phone] Fax: [phone] Hours: Not open 24 hours

## 2024-10-10 ENCOUNTER — Ambulatory Visit

## 2024-10-10 DIAGNOSIS — M6281 Muscle weakness (generalized): Secondary | ICD-10-CM

## 2024-10-10 DIAGNOSIS — Z96651 Presence of right artificial knee joint: Secondary | ICD-10-CM | POA: Diagnosis not present

## 2024-10-10 NOTE — Therapy (Signed)
 " OUTPATIENT PHYSICAL THERAPY LOWER EXTREMITY TREATMENT   Patient Name: Christopher Spence MRN: [id number] DOB:[birth year], 64 y.o., male Today's Date: 10/10/2024  END OF SESSION:  PT End of Session - 10/10/24 0937     Visit Number 5    Number of Visits 24    Date for Recertification  12/26/24    Authorization Type [employer] Medicare    PT Start Time 0930    PT Stop Time 1010    PT Time Calculation (min) 40 min    Activity Tolerance Patient tolerated treatment well             Past Medical History:  Diagnosis Date   Allergy    occ- mild   Arthritis    knees   Diabetes (HCC)    Hyperlipidemia    Hypertension    Personal history of colonic polyps-adenomas 10/15/2012   Sleep apnea    Past Surgical History:  Procedure Laterality Date   COLONOSCOPY  10/15/2012   Procedure: COLONOSCOPY;  Surgeon: Lupita FORBES Commander, MD;  Location: [facility];  Service: Endoscopy;  Laterality: N/A;   COLONOSCOPY     Family history of colon cancer     KNEE ARTHROSCOPY     bilateral; twice on each knee   KNEE ARTHROTOMY  1976   right   POLYPECTOMY     UMBILICAL HERNIA REPAIR     x 2   Patient Active Problem List   Diagnosis Date Noted   Arthralgia of left knee 07/08/2024   Dupuytren's disease of palm of left hand 01/22/2021   Chronic pain of both knees 01/29/2016   Swimmer's ear 04/22/2013   Hypertension    Hyperlipidemia    Diabetes (HCC)    History of colonic polyps 10/15/2012    PCP: Duanne Butler DASEN, MD Ref Provider (PCP)   REFERRING PROVIDER: Rubie Kemps, MD  REFERRING DIAG: M17.11 (ICD-10-CM) - Unilateral primary osteoarthritis, right knee  THERAPY DIAG:  Muscle weakness (generalized)  Status post total right knee replacement  Rationale for Evaluation and Treatment: rehabilitation  ONSET DATE: 09/16/24  SUBJECTIVE:   SUBJECTIVE STATEMENT: No pain today, still a little bit sore. Has been doing exercises. Was a little bit sore after last session.  EVAL: Has been  using CPM machine consistently alongside doing HEP.   PERTINENT HISTORY: N/a PAIN:  6W, 0C Are you having pain? Yes: NPRS scale: 0/10 Pain location: R knee Pain description:   Aggravating factors: trying to  Relieving factors: medications  PRECAUTIONS: None  RED FLAGS: None   WEIGHT BEARING RESTRICTIONS: No  FALLS:  Has patient fallen in last 6 months? Yes. Number of falls 1  OCCUPATION: n/a  PLOF: Independent  PATIENT GOALS: improve strength, stairs, light jog if possible  OBJECTIVE:  Note: Objective measures were completed at Evaluation unless otherwise noted.   PATIENT SURVEYS:  LEFS  Extreme difficulty/unable (0), Quite a bit of difficulty (1), Moderate difficulty (2), Little difficulty (3), No difficulty (4) Survey date:    Any of your usual work, housework or school activities   2. Usual hobbies, recreational or sporting activities   3. Getting into/out of the bath   4. Walking between rooms   5. Putting on socks/shoes   6. Squatting    7. Lifting an object, like a bag of groceries from the floor   8. Performing light activities around your home   9. Performing heavy activities around your home   10. Getting into/out of a car   11.  Walking 2 blocks   12. Walking 1 mile   13. Going up/down 10 stairs (1 flight)   14. Standing for 1 hour   15.  sitting for 1 hour   16. Running on even ground   17. Running on uneven ground   18. Making sharp turns while running fast   19. Hopping    20. Rolling over in bed   Score total:  41     COGNITION: Overall cognitive status: Within functional limits for tasks assessed     SENSATION: WFL   POSTURE: rounded shoulders and forward head  PALPATION: TTP R knee  LOWER EXTREMITY ROM:  Active ROM Right eval Left eval RT 10/01/24 RT 10/03/24  Hip flexion      Hip extension      Hip abduction      Hip adduction      Hip internal rotation      Hip external rotation      Knee flexion 90 100 100P 102A  Knee  extension -15 -10 -5 -5  Ankle dorsiflexion      Ankle plantarflexion      Ankle inversion      Ankle eversion       (Blank rows = not tested)  LOWER EXTREMITY MMT:  MMT Right eval Left eval  Hip flexion 4- 4-  Hip extension    Hip abduction 3+ 3+  Hip adduction 3+ 3+  Hip internal rotation    Hip external rotation    Knee flexion 4 4+  Knee extension 3+! 4+  Ankle dorsiflexion    Ankle plantarflexion    Ankle inversion    Ankle eversion     (Blank rows = not tested)      GAIT: Distance walked: 30 Assistive device utilized: None Level of assistance: Complete Independence Comments: very slight limping gait on R side                                                                                                                                TREATMENT DATE:  10/10/24 Therapeutic Exercise/Activity: Nustep x50min L4 HEP reassessment and update Seated LAQ x8x3s GTB, 2x8x3s Blue TB Seated HS curl 2x8x3s Blue TB Seated march 2x8x3s blue TB  Seated blue TB clamshell 2x8x3s Supine bridge 2x8x3s Standing to floor back to standing with B UE counter support x1 Leg extension 5# x6, x4x3s SL leg extension 35# x6 6 inch step up with SUE x6    10/07/24 Therapeutic Exercise/Activity: HEP reassessment and update Seated LAQ 2x8x3s, 2x8x3s GTB Seated HS slide 2x8x3s, 2x8x3s GTB Seated hip march x8x3s RTB, x8x3s GTB Seated clamshell x8x3s RTB, x8x3s GTB, 2x8x3s Blue TB STS x8 6 inch step up with SUE x8     [facility] Adult PT Treatment:  DATE: 10/03/24 Therapeutic Exercise: Supine heel slide to qs x10 Long sit hamstring/knee ext stretch c strap x3 3 Therapeutic Activity: Nustep L4 UE/LE c progressive flexion Lat step ups 4 c L hand A 2x10 Standing hip abd 2x12 4# Standing hip ext 2x12 4# Standing knee flex 2x12 4# Standing heel raise/toe lifts STS 2x10 from elevated  [facility] Adult PT Treatment:                                                 DATE: 10/01/24 Therapeutic Exercise: Nustep L4 UE/LE c progressive flexion QS x10 5 HS x10 3 HS c strap x5 30 SLR c QS 2x10 S/L hip abd 2x10 S/L clams 2x10 Bridging 2x10 SL balance with hand assist x3 20  TREATMENT 09/27/2024:  Therapeutic Exercise: Quadset x8x3s SLR x8x3s Heel slide with cord x8x3s    Self-care/Home Management: Patient educated on HEP, POC, prognosis, and relevant tissues/anatomy.     PATIENT EDUCATION:  Education details: HEP Person educated: Patient Education method: Programmer, Multimedia, Facilities Manager, and Handouts Education comprehension: verbalized understanding and returned demonstration  HOME EXERCISE PROGRAM: Access Code: [email or web address] URL: [email or web address] Date: 10/10/2024 Prepared by: Washington Scot  Exercises - Sit to Stand with Arms Crossed  - 2 x daily - 5 x weekly - 2 sets - 8 reps - 3 hold - Single Leg Knee Extension with Weight Machine  - 2 x daily - 5 x weekly - 2 sets - 6 reps - 3 hold - Single Leg Hamstring Curl with Weight Machine  - 2 x daily - 5 x weekly - 2 sets - 6 reps - 3 hold - Single Leg Press  - 2 x daily - 5 x weekly - 2 sets - 6 reps - 3 hold - Forward Step Up with Counter Support  - 2 x daily - 5 x weekly - 2 sets - 6 reps - 3 hold    ASSESSMENT:  CLINICAL IMPRESSION: Patient tolerated treatment with no increases in pain with progressions in RLE loading and functional strength. Current deficits include: functional activity tolerance, RLE strength, and excessive pain. As a result, patient would continue to benefit from skilled PT to address said deficits via plan below.    EVAL: Patient is a 64 year old male who presents with s/p R TKA on 09/16/24. Patient presents with deficits in: BL hip strength, excessive pain, and ROM. As a result, the patient would benefit from skilled PT to address aforementioned deficits via plan below.   OBJECTIVE IMPAIRMENTS: decreased ROM and decreased  strength.   ACTIVITY LIMITATIONS: standing, squatting, and stairs  PERSONAL FACTORS: Age are also affecting patient's functional outcome.   REHAB POTENTIAL: Good  CLINICAL DECISION MAKING: Evolving/moderate complexity  EVALUATION COMPLEXITY: Moderate   GOALS: Goals reviewed with patient? No  SHORT TERM GOALS: Target date: 10/18/2024   1) Patient will demonstrate 75% HEP compliance to show independence with self-management of condition   Baseline: 0% Goal status: INITIAL  2) Patient will decrease worst pain/discomfort to 4 at most to improve ADL completion and overall QOL   Baseline: 6 Goal status: INITIAL    LONG TERM GOALS: Target date: 12/26/24   1) Patient will demonstrate 100% HEP compliance to show independence with self-management of condition   Baseline: 0% Goal status: INITIAL  2) Patient will decrease worst pain to 2 at most  to improve ADL completion and overall QOL   Baseline: 6 Goal status: INITIAL  3) Patient will demonstrate a 10 point improvement in LEFS to show improvements in ADL completion and overall QOL    Baseline: 41 Goal status: INITIAL  4) Patient will demonstrate at least 4/5 MMT score of R knee and glutes to show improvements in muscle strength needed for ADL completion.     Baseline: see chart Goal status: INITIAL  5) Patient will demonstrate ability to ambulate 10 stairs with no significant increases in pain to demonstrate functional strength for community ambulation.    Baseline: unable Goal status: INITIAL    PLAN:  PT FREQUENCY: 1-2x/week  PT DURATION: 12 weeks  PLANNED INTERVENTIONS: 97110-Therapeutic exercises, 97530- Therapeutic activity, [id number]- Neuromuscular re-education, 97535- Self Care, [id number]- Manual therapy, [phone]- Gait training, Patient/Family education, Balance training, and Stair training  PLAN FOR NEXT SESSION: HEP assessment and progression, symptom modulation, and loading (isolated and/or  functional). Manual therapy, aerobic, gait, and NME training as needed. Improve R knee ROM via loading as tolerated.   Washington Odessia Scot  PT, DPT      "

## 2024-10-14 ENCOUNTER — Other Ambulatory Visit: Payer: Self-pay | Admitting: Family Medicine

## 2024-10-14 DIAGNOSIS — I10 Essential (primary) hypertension: Secondary | ICD-10-CM

## 2024-10-14 DIAGNOSIS — R0609 Other forms of dyspnea: Secondary | ICD-10-CM

## 2024-10-14 NOTE — Therapy (Signed)
 " OUTPATIENT PHYSICAL THERAPY LOWER EXTREMITY TREATMENT/ReAuth   Patient Name: Christopher Spence MRN: [id number] DOB:Sep 25, 1961, 64 y.o., male Today's Date: 10/15/2024  END OF SESSION:  PT End of Session - 10/15/24 0931     Visit Number 6    Number of Visits 24    Date for Recertification  12/26/24    Authorization Type [employer] Medicare    Authorization Time Period Approved 6 PT visits from 09/27/24-11/22/24    PT Start Time 0931    PT Stop Time 1015    PT Time Calculation (min) 44 min    Activity Tolerance Patient tolerated treatment well    Behavior During Therapy [facility] for tasks assessed/performed           Past Medical History:  Diagnosis Date   Allergy    occ- mild   Arthritis    knees   Diabetes (HCC)    Hyperlipidemia    Hypertension    Personal history of colonic polyps-adenomas 10/15/2012   Sleep apnea    Past Surgical History:  Procedure Laterality Date   COLONOSCOPY  10/15/2012   Procedure: COLONOSCOPY;  Surgeon: Lupita FORBES Commander, MD;  Location: [facility];  Service: Endoscopy;  Laterality: N/A;   COLONOSCOPY     Family history of colon cancer     KNEE ARTHROSCOPY     bilateral; twice on each knee   KNEE ARTHROTOMY  1976   right   POLYPECTOMY     UMBILICAL HERNIA REPAIR     x 2   Patient Active Problem List   Diagnosis Date Noted   Arthralgia of left knee 07/08/2024   Dupuytren's disease of palm of left hand 01/22/2021   Chronic pain of both knees 01/29/2016   Swimmer's ear 04/22/2013   Hypertension    Hyperlipidemia    Diabetes (HCC)    History of colonic polyps 10/15/2012    PCP: Duanne Butler DASEN, MD Ref Provider (PCP)   REFERRING PROVIDER: Rubie Kemps, MD  REFERRING DIAG: M17.11 (ICD-10-CM) - Unilateral primary osteoarthritis, right knee  THERAPY DIAG:  Muscle weakness (generalized)  Status post total right knee replacement  Acute pain of right knee  Rationale for Evaluation and Treatment: rehabilitation  ONSET DATE:  09/16/24  SUBJECTIVE:   SUBJECTIVE STATEMENT: Pt reports His R knee is uncomfortable at night which affects his sleep.   EVAL: Has been using CPM machine consistently alongside doing HEP.   PERTINENT HISTORY: N/a PAIN:  Currently: 0/10 6W, 0C   PRECAUTIONS: None  RED FLAGS: None   WEIGHT BEARING RESTRICTIONS: No  FALLS:  Has patient fallen in last 6 months? Yes. Number of falls 1  OCCUPATION: n/a  PLOF: Independent  PATIENT GOALS: improve strength, stairs, light jog if possible  OBJECTIVE:  Note: Objective measures were completed at Evaluation unless otherwise noted.   PATIENT SURVEYS:  LEFS  Extreme difficulty/unable (0), Quite a bit of difficulty (1), Moderate difficulty (2), Little difficulty (3), No difficulty (4) Survey date:    Any of your usual work, housework or school activities   2. Usual hobbies, recreational or sporting activities   3. Getting into/out of the bath   4. Walking between rooms   5. Putting on socks/shoes   6. Squatting    7. Lifting an object, like a bag of groceries from the floor   8. Performing light activities around your home   9. Performing heavy activities around your home   10. Getting into/out of a car   11. Walking  2 blocks   12. Walking 1 mile   13. Going up/down 10 stairs (1 flight)   14. Standing for 1 hour   15.  sitting for 1 hour   16. Running on even ground   17. Running on uneven ground   18. Making sharp turns while running fast   19. Hopping    20. Rolling over in bed   Score total:  41     COGNITION: Overall cognitive status: Within functional limits for tasks assessed     SENSATION: WFL   POSTURE: rounded shoulders and forward head  PALPATION: TTP R knee  LOWER EXTREMITY ROM:  Active ROM Right eval Left eval RT 10/01/24 RT 10/03/24 Rt 10/15/24  Hip flexion       Hip extension       Hip abduction       Hip adduction       Hip internal rotation       Hip external rotation       Knee  flexion 90 100 100P 102A 110A  Knee extension -15 -10 -5 -5 0A  Ankle dorsiflexion       Ankle plantarflexion       Ankle inversion       Ankle eversion        (Blank rows = not tested)  LOWER EXTREMITY MMT:  MMT Right eval Left eval RT 10/15/24  Hip flexion 4- 4- 4  Hip extension     Hip abduction 3+ 3+ 4  Hip adduction 3+ 3+ 4  Hip internal rotation     Hip external rotation     Knee flexion 4 4+ 4+  Knee extension 3+! 4+ 4+  Ankle dorsiflexion     Ankle plantarflexion     Ankle inversion     Ankle eversion      (Blank rows = not tested)      GAIT: Distance walked: 30 Assistive device utilized: None Level of assistance: Complete Independence Comments: very slight limping gait on R side                                                                                                                                TREATMENT DATE:  [facility] Adult PT Treatment:                                                DATE: 10/15/24 Therapeutic Exercise/Activity: Nustep x46min L6 Heel slides x10, x3 20 c strap Quad sets x10 5 LEFS 48/80 MMT Seated LAQ x8x3s GTB, 2x8x3s Blue TB Seated HS curl 2x8x3s Blue TB FWD step ups/downs 6 step x10 c mod use of L hand for assist   FWD step ups/downs 4 step x10 c min use of L hand for assist   SL leg extension L 40#  x5; R unable to move 20# (the lowest weight) due to weakness Bil leg extension press c min A L LE 20# x10 HEP reassessment and update   10/10/24 Therapeutic Exercise/Activity: Nustep x45min L4 HEP reassessment and update Seated LAQ x8x3s GTB, 2x8x3s Blue TB Seated HS curl 2x8x3s Blue TB Seated march 2x8x3s blue TB  Seated blue TB clamshell 2x8x3s Supine bridge 2x8x3s Standing to floor back to standing with B UE counter support x1 Leg extension 5# x6, x4x3s SL leg extension 35# x6 6 inch step up with SUE x6  PATIENT EDUCATION:  Education details: HEP Person educated: Patient Education method: Programmer, Multimedia,  Facilities Manager, and Handouts Education comprehension: verbalized understanding and returned demonstration  HOME EXERCISE PROGRAM: Access Code: [email or web address] URL: [email or web address] Date: 10/10/2024 Prepared by: Washington Scot  Exercises - Sit to Stand with Arms Crossed  - 2 x daily - 5 x weekly - 2 sets - 8 reps - 3 hold - Single Leg Knee Extension with Weight Machine  - 2 x daily - 5 x weekly - 2 sets - 6 reps - 3 hold - Single Leg Hamstring Curl with Weight Machine  - 2 x daily - 5 x weekly - 2 sets - 6 reps - 3 hold - Single Leg Press  - 2 x daily - 5 x weekly - 2 sets - 6 reps - 3 hold - Forward Step Up with Counter Support  - 2 x daily - 5 x weekly - 2 sets - 6 reps - 3 hold    ASSESSMENT:  CLINICAL IMPRESSION: PT was completed for ROM, strengthen, and functional mobility c focus on asc/dsc steps. Pt is making appropriate progress in all areas c AROM 0-110d, improved strength per MMT, and walking with a gait pattern WNLs. Pt demonstrates functional R leg weakness with ascending and descending steps with his needing moderate UE assist for a 6 step (a normal step is 8). Pt's R knee flexion has progressed well, but will need 120d to descend steps with the L leg lending. Pt's R leg weakness is also apparent with the leg press ex with the L able to complete 40# and the R unable to move the lowest weight of 20#. Overall, pt has made appropriate progress re: his time frame s/p a R TKA. Pt will continue to benefit from skilled PT 2w6 to address ROM and strength impairments of the R knee for improved functional mobility.   EVAL: Patient is a 64 year old male who presents with s/p R TKA on 09/16/24. Patient presents with deficits in: BL hip strength, excessive pain, and ROM. As a result, the patient would benefit from skilled PT to address aforementioned deficits via plan below.   OBJECTIVE IMPAIRMENTS: decreased ROM and decreased strength.   ACTIVITY LIMITATIONS: standing,  squatting, and stairs  PERSONAL FACTORS: Age are also affecting patient's functional outcome.   REHAB POTENTIAL: Good  CLINICAL DECISION MAKING: Evolving/moderate complexity  EVALUATION COMPLEXITY: Moderate   GOALS: Goals reviewed with patient? No  SHORT TERM GOALS: Target date: 10/18/2024   1) Patient will demonstrate 75% HEP compliance to show independence with self-management of condition  Baseline: 0% Goal status: MET- 100% compliance with daily completion  2) Patient will decrease worst pain/discomfort to 4 at most to improve ADL completion and overall QOL   Baseline:  Goal status: MET- 1-2/10 at most    LONG TERM GOALS: Target date: 12/26/24   1) Patient will demonstrate 100% HEP compliance to show independence with self-management of condition  Baseline: 0% Goal status: ONGOING  2) Patient will decrease worst pain to 2 at most to improve ADL completion and overall QOL Baseline: 6 Goal status: ONGOING  3) Patient will demonstrate a 10 point improvement in LEFS to show improvements in ADL completion and overall QOL Baseline: 41 10/15/24: 48 Goal status: IMPROVED  4) Patient will demonstrate at least 4/5 MMT score of R knee and glutes to show improvements in muscle strength needed for ADL completion.  Baseline: see chart 10/16/23: see flow sheet Goal status: IMPROVED  5) Patient will demonstrate ability to ambulate 10 stairs with no significant increases in pain to demonstrate functional strength for community ambulation. Baseline: Completed c primary use of L LE and UEs Goal status: ONGOING  6) Patient will demonstrate 120d of L knee flexion to dsc steps with L LE as the lead leg Baseline: 110d Goal status: New 10/15/24  7) Patient will demonstrate completion of the R leg press at 35# for 5 steps for improved ability to ascend/descend steps with an alternating pattern between LEs Baseline: Uses L LE primarily Goal status: New 10/15/24      PLAN:  PT  FREQUENCY: 1-2x/week  PT DURATION: 12 weeks  PLANNED INTERVENTIONS: 97110-Therapeutic exercises, 97530- Therapeutic activity, 97112- Neuromuscular re-education, 97535- Self Care, 02859- Manual therapy, [phone]- Gait training, Patient/Family education, Balance training, and Stair training  PLAN FOR NEXT SESSION: HEP assessment and progression, symptom modulation, and loading (isolated and/or functional). Manual therapy, aerobic, gait, and NME training as needed. Improve R knee ROM via loading as tolerated.  Sundus Pete MS, PT 10/15/24 1:19 PM  Date of referral: 08/14/24 Referring provider: Rubie Kemps, MD Referring diagnosis? M17.11 (ICD-10-CM) - Unilateral primary osteoarthritis, right knee Treatment diagnosis? (if different than referring diagnosis) Status post total right knee replacement   Muscle weakness (generalized)   Acute pain of right knee   What was this (referring dx) caused by? Surgery (Type: R TKA)   Nature of Condition: Initial Onset (within last 3 months)              Laterality: Rt   Current Functional Measure Score: LEFS 41 to 49 on 10/15/24   Objective measurements identify impairments when they are compared to normal values, the uninvolved extremity, and prior level of function.             [x]  Yes             []  No   Objective assessment of functional ability: Moderate functional limitations              Briefly describe symptoms: discomfort/ache when trying to sleep, weakness of the R leg, decreased R knee flexion AROM    How did symptoms start: n/a   Average pain intensity:             Last 24 hours:1-2/10             Past week:1-2/10   How often does the pt experience symptoms? Intermittent   How much have the symptoms interfered with usual daily activities? Moderately   How has condition changed since care began at this facility? Yes, pt is making appropriate progress with R knee AROM, strength, and function for time frame s/p R TKA    In general,  how is the patients overall health? Good     BACK PAIN (STarT Back Screening Tool) No       "

## 2024-10-15 ENCOUNTER — Ambulatory Visit

## 2024-10-15 DIAGNOSIS — M6281 Muscle weakness (generalized): Secondary | ICD-10-CM

## 2024-10-15 DIAGNOSIS — Z96651 Presence of right artificial knee joint: Secondary | ICD-10-CM

## 2024-10-15 DIAGNOSIS — M25561 Pain in right knee: Secondary | ICD-10-CM

## 2024-10-15 NOTE — Telephone Encounter (Signed)
 Requested Prescriptions  Pending Prescriptions Disp Refills   atorvastatin  (LIPITOR) 20 MG tablet [Pharmacy Med Name: Atorvastatin  Calcium  20 MG Oral Tablet] 100 tablet 2    Sig: TAKE 1 TABLET BY MOUTH ONCE  DAILY     Cardiovascular:  Antilipid - Statins Failed - 10/15/2024  2:07 PM      Failed - Lipid Panel in normal range within the last 12 months    Cholesterol  Date Value Ref Range Status  07/09/2024 150 <200 mg/dL Final   LDL Cholesterol (Calc)  Date Value Ref Range Status  07/09/2024 72 mg/dL (calc) Final    Comment:    Reference range: <100 . Desirable range <100 mg/dL for primary prevention;   <70 mg/dL for patients with CHD or diabetic patients  with > or = 2 CHD risk factors. SABRA LDL-C is now calculated using the Martin-Hopkins  calculation, which is a validated novel method providing  better accuracy than the Friedewald equation in the  estimation of LDL-C.  Gladis APPLETHWAITE et al. SANDREA. 7986;689(80): 2061-2068  (http://education.[facility].com/faq/FAQ164)    HDL  Date Value Ref Range Status  07/09/2024 61 > OR = 40 mg/dL Final   Triglycerides  Date Value Ref Range Status  07/09/2024 87 <150 mg/dL Final         Passed - Patient is not pregnant      Passed - Valid encounter within last 12 months    Recent Outpatient Visits           2 months ago Prostate cancer screening   [facility] Duanne Butler DASEN, MD   3 months ago Controlled type 2 diabetes mellitus without complication, without long-term current use of insulin (HCC)   [facility] Pickard, Butler DASEN, MD   1 year ago Routine general medical examination at a health care facility   [facility] Duanne Butler DASEN, MD   1 year ago Inflamed sebaceous cyst   [place] [facility] Duanne Butler DASEN, MD   1 year ago Morbid obesity [facility])   [place] [facility] Pickard, Butler DASEN, MD                furosemide  (LASIX ) 40 MG tablet [Pharmacy Med Name: Furosemide  40 MG Oral Tablet] 200 tablet 2    Sig: TAKE 1 TABLET BY MOUTH TWICE  DAILY     Cardiovascular:  Diuretics - Loop Failed - 10/15/2024  2:07 PM      Failed - Cr in normal range and within 180 days    Creat  Date Value Ref Range Status  07/09/2024 0.69 (L) 0.70 - 1.35 mg/dL Final   Creatinine, Urine  Date Value Ref Range Status  07/09/2024 66 20 - 320 mg/dL Final         Failed - Mg Level in normal range and within 180 days    No results found for: MG       Passed - K in normal range and within 180 days    Potassium  Date Value Ref Range Status  07/09/2024 4.6 3.5 - 5.3 mmol/L Final         Passed - Ca in normal range and within 180 days    Calcium   Date Value Ref Range Status  07/09/2024 10.1 8.6 - 10.3 mg/dL Final         Passed - Na in normal range and within 180 days  Sodium  Date Value Ref Range Status  07/09/2024 142 135 - 146 mmol/L Final         Passed - Cl in normal range and within 180 days    Chloride  Date Value Ref Range Status  07/09/2024 106 98 - 110 mmol/L Final         Passed - Last BP in normal range    BP Readings from Last 1 Encounters:  08/05/24 136/76         Passed - Valid encounter within last 6 months    Recent Outpatient Visits           2 months ago Prostate cancer screening   [facility] Duanne Butler DASEN, MD   3 months ago Controlled type 2 diabetes mellitus without complication, without long-term current use of insulin [facility])   [facility] Pickard, Butler DASEN, MD   1 year ago Routine general medical examination at a health care facility   [facility] Duanne Butler DASEN, MD   1 year ago Inflamed sebaceous cyst   [place] Duanne, Butler DASEN, MD   1 year ago Morbid obesity [facility])   [place] [facility] Pickard, Butler DASEN,  MD               doxazosin  (CARDURA ) 4 MG tablet [Pharmacy Med Name: Doxazosin  Mesylate 4 MG Oral Tablet] 100 tablet 2    Sig: TAKE 1 TABLET BY MOUTH DAILY     Cardiovascular:  Alpha Blockers Passed - 10/15/2024  2:07 PM      Passed - Last BP in normal range    BP Readings from Last 1 Encounters:  08/05/24 136/76         Passed - Valid encounter within last 6 months    Recent Outpatient Visits           2 months ago Prostate cancer screening   [facility] Duanne Butler DASEN, MD   3 months ago Controlled type 2 diabetes mellitus without complication, without long-term current use of insulin [facility])   [place] [facility] Pickard, Butler DASEN, MD   1 year ago Routine general medical examination at a health care facility   [facility] Duanne Butler DASEN, MD   1 year ago Inflamed sebaceous cyst   [place] [facility] Duanne Butler DASEN, MD   1 year ago Morbid obesity [facility])   [place] [facility] Pickard, Butler DASEN, MD

## 2024-10-17 ENCOUNTER — Ambulatory Visit

## 2024-10-17 DIAGNOSIS — Z96651 Presence of right artificial knee joint: Secondary | ICD-10-CM | POA: Diagnosis not present

## 2024-10-17 DIAGNOSIS — M6281 Muscle weakness (generalized): Secondary | ICD-10-CM

## 2024-10-17 DIAGNOSIS — M25561 Pain in right knee: Secondary | ICD-10-CM

## 2024-10-17 NOTE — Therapy (Signed)
 " OUTPATIENT PHYSICAL THERAPY LOWER EXTREMITY TREATMENT/ReAuth   Patient Name: Christopher Spence MRN: [id number] DOB:1961/01/02, 64 y.o., male Today's Date: 10/17/2024  END OF SESSION:  PT End of Session - 10/17/24 0938     Visit Number 7    Number of Visits 24    Date for Recertification  12/26/24    Authorization Type [employer] Medicare    Authorization Time Period Approved 6 PT visits from 09/27/24-11/22/24    PT Start Time 0930    PT Stop Time 1010    PT Time Calculation (min) 40 min    Activity Tolerance Patient tolerated treatment well    Behavior During Therapy [facility] for tasks assessed/performed            Past Medical History:  Diagnosis Date   Allergy    occ- mild   Arthritis    knees   Diabetes (HCC)    Hyperlipidemia    Hypertension    Personal history of colonic polyps-adenomas 10/15/2012   Sleep apnea    Past Surgical History:  Procedure Laterality Date   COLONOSCOPY  10/15/2012   Procedure: COLONOSCOPY;  Surgeon: Lupita FORBES Commander, MD;  Location: [facility];  Service: Endoscopy;  Laterality: N/A;   COLONOSCOPY     Family history of colon cancer     KNEE ARTHROSCOPY     bilateral; twice on each knee   KNEE ARTHROTOMY  1976   right   POLYPECTOMY     UMBILICAL HERNIA REPAIR     x 2   Patient Active Problem List   Diagnosis Date Noted   Arthralgia of left knee 07/08/2024   Dupuytren's disease of palm of left hand 01/22/2021   Chronic pain of both knees 01/29/2016   Swimmer's ear 04/22/2013   Hypertension    Hyperlipidemia    Diabetes (HCC)    History of colonic polyps 10/15/2012    PCP: Duanne Butler DASEN, MD Ref Provider (PCP)   REFERRING PROVIDER: Rubie Kemps, MD  REFERRING DIAG: M17.11 (ICD-10-CM) - Unilateral primary osteoarthritis, right knee  THERAPY DIAG:  Muscle weakness (generalized)  Acute pain of right knee  Status post total right knee replacement  Rationale for Evaluation and Treatment: rehabilitation  ONSET DATE:  09/16/24  SUBJECTIVE:   SUBJECTIVE STATEMENT: Pt reports doing well with HEP and able to go to the gym. No pain today. 4 nights ago not able to get comfy, but past 2 nights have been not as bad.   EVAL: Has been using CPM machine consistently alongside doing HEP.   PERTINENT HISTORY: N/a PAIN:  Currently: 0/10 6W, 0C   PRECAUTIONS: None  RED FLAGS: None   WEIGHT BEARING RESTRICTIONS: No  FALLS:  Has patient fallen in last 6 months? Yes. Number of falls 1  OCCUPATION: n/a  PLOF: Independent  PATIENT GOALS: improve strength, stairs, light jog if possible  OBJECTIVE:  Note: Objective measures were completed at Evaluation unless otherwise noted.   PATIENT SURVEYS:  LEFS  Extreme difficulty/unable (0), Quite a bit of difficulty (1), Moderate difficulty (2), Little difficulty (3), No difficulty (4) Survey date:    Any of your usual work, housework or school activities   2. Usual hobbies, recreational or sporting activities   3. Getting into/out of the bath   4. Walking between rooms   5. Putting on socks/shoes   6. Squatting    7. Lifting an object, like a bag of groceries from the floor   8. Performing light activities around your home  9. Performing heavy activities around your home   10. Getting into/out of a car   11. Walking 2 blocks   12. Walking 1 mile   13. Going up/down 10 stairs (1 flight)   14. Standing for 1 hour   15.  sitting for 1 hour   16. Running on even ground   17. Running on uneven ground   18. Making sharp turns while running fast   19. Hopping    20. Rolling over in bed   Score total:  41     COGNITION: Overall cognitive status: Within functional limits for tasks assessed     SENSATION: WFL   POSTURE: rounded shoulders and forward head  PALPATION: TTP R knee  LOWER EXTREMITY ROM:  Active ROM Right eval Left eval RT 10/01/24 RT 10/03/24 Rt 10/15/24  Hip flexion       Hip extension       Hip abduction       Hip adduction        Hip internal rotation       Hip external rotation       Knee flexion 90 100 100P 102A 110A  Knee extension -15 -10 -5 -5 0A  Ankle dorsiflexion       Ankle plantarflexion       Ankle inversion       Ankle eversion        (Blank rows = not tested)  LOWER EXTREMITY MMT:  MMT Right eval Left eval RT 10/15/24  Hip flexion 4- 4- 4  Hip extension     Hip abduction 3+ 3+ 4  Hip adduction 3+ 3+ 4  Hip internal rotation     Hip external rotation     Knee flexion 4 4+ 4+  Knee extension 3+! 4+ 4+  Ankle dorsiflexion     Ankle plantarflexion     Ankle inversion     Ankle eversion      (Blank rows = not tested)      GAIT: Distance walked: 30 Assistive device utilized: None Level of assistance: Complete Independence Comments: very slight limping gait on R side                                                                                                                                TREATMENT DATE:  10/17/24 Therapeutic Exercise/activity: HEP reassessment and update Seated LAQ x8x3s Blue TB, 2x6x3s Black TB Seated HS curl x8x3s Blue TB, 2x6x3s Black TB Seated march 2x8x3s blue TB  Supine bridge 2x8x3s 4 inch step up with CG x6 SL leg press x8 35#  Did not do: Leg extension 5# x6, x4x3s SL leg extension 35# x6    [facility] Adult PT Treatment:  DATE: 10/15/24 Therapeutic Exercise/Activity: Nustep x31min L6 Heel slides x10, x3 20 c strap Quad sets x10 5 LEFS 48/80 MMT Seated LAQ x8x3s GTB, 2x8x3s Blue TB Seated HS curl 2x8x3s Blue TB FWD step ups/downs 6 step x10 c mod use of L hand for assist   FWD step ups/downs 4 step x10 c min use of L hand for assist   SL leg extension L 40# x5; R unable to move 20# (the lowest weight) due to weakness Bil leg extension press c min A L LE 20# x10 HEP reassessment and update   10/10/24 Therapeutic Exercise/Activity: Nustep x36min L4 HEP reassessment and update Seated LAQ  x8x3s GTB, 2x8x3s Blue TB Seated HS curl 2x8x3s Blue TB Seated march 2x8x3s blue TB  Seated blue TB clamshell 2x8x3s Supine bridge 2x8x3s Standing to floor back to standing with B UE counter support x1 Leg extension 5# x6, x4x3s SL leg extension 35# x6 6 inch step up with SUE x6  PATIENT EDUCATION:  Education details: HEP Person educated: Patient Education method: Programmer, Multimedia, Facilities Manager, and Handouts Education comprehension: verbalized understanding and returned demonstration  HOME EXERCISE PROGRAM: Access Code: [email or web address] URL: [email or web address] Date: 10/10/2024 Prepared by: Washington Scot  Exercises - Sit to Stand with Arms Crossed  - 2 x daily - 5 x weekly - 2 sets - 8 reps - 3 hold - Single Leg Knee Extension with Weight Machine  - 2 x daily - 5 x weekly - 2 sets - 6 reps - 3 hold - Single Leg Hamstring Curl with Weight Machine  - 2 x daily - 5 x weekly - 2 sets - 6 reps - 3 hold - Single Leg Press  - 2 x daily - 5 x weekly - 2 sets - 6 reps - 3 hold - Forward Step Up with Counter Support  - 2 x daily - 5 x weekly - 2 sets - 6 reps - 3 hold    ASSESSMENT:  CLINICAL IMPRESSION: Patient tolerated treatment with no increases in pain with progressions in RLE isolated and functional loading. Current deficits include: functional activity and strength, excessive pain, and ROM. As a result, patient would continue to benefit from skilled PT to address said deficits via plan below.    EVAL: Patient is a 64 year old male who presents with s/p R TKA on 09/16/24. Patient presents with deficits in: BL hip strength, excessive pain, and ROM. As a result, the patient would benefit from skilled PT to address aforementioned deficits via plan below.   OBJECTIVE IMPAIRMENTS: decreased ROM and decreased strength.   ACTIVITY LIMITATIONS: standing, squatting, and stairs  PERSONAL FACTORS: Age are also affecting patient's functional outcome.   REHAB POTENTIAL:  Good  CLINICAL DECISION MAKING: Evolving/moderate complexity  EVALUATION COMPLEXITY: Moderate   GOALS: Goals reviewed with patient? No  SHORT TERM GOALS: Target date: 10/18/2024   1) Patient will demonstrate 75% HEP compliance to show independence with self-management of condition  Baseline: 0% Goal status: MET- 100% compliance with daily completion  2) Patient will decrease worst pain/discomfort to 4 at most to improve ADL completion and overall QOL   Baseline:  Goal status: MET- 1-2/10 at most    LONG TERM GOALS: Target date: 12/26/24   1) Patient will demonstrate 100% HEP compliance to show independence with self-management of condition Baseline: 0% Goal status: ONGOING  2) Patient will decrease worst pain to 2 at most to improve ADL completion and overall QOL Baseline: 6 Goal status: ONGOING  3) Patient  will demonstrate a 10 point improvement in LEFS to show improvements in ADL completion and overall QOL Baseline: 41 10/15/24: 48 Goal status: IMPROVED  4) Patient will demonstrate at least 4/5 MMT score of R knee and glutes to show improvements in muscle strength needed for ADL completion.  Baseline: see chart 10/16/23: see flow sheet Goal status: IMPROVED  5) Patient will demonstrate ability to ambulate 10 stairs with no significant increases in pain to demonstrate functional strength for community ambulation. Baseline: Completed c primary use of L LE and UEs Goal status: ONGOING  6) Patient will demonstrate 120d of L knee flexion to dsc steps with L LE as the lead leg Baseline: 110d Goal status: New 10/15/24  7) Patient will demonstrate completion of the R leg press at 35# for 5 steps for improved ability to ascend/descend steps with an alternating pattern between LEs Baseline: Uses L LE primarily Goal status: New 10/15/24      PLAN:  PT FREQUENCY: 1-2x/week  PT DURATION: 12 weeks  PLANNED INTERVENTIONS: 97110-Therapeutic exercises, 97530-  Therapeutic activity, 97112- Neuromuscular re-education, 97535- Self Care, [id number]- Manual therapy, [phone]- Gait training, Patient/Family education, Balance training, and Stair training  PLAN FOR NEXT SESSION: HEP assessment and progression, symptom modulation, and loading (isolated and/or functional). Manual therapy, aerobic, gait, and NME training as needed. Improve R knee ROM via loading as tolerated.  Allen Ralls MS, PT 10/17/24 10:15 AM  Date of referral: 08/14/24 Referring provider: Rubie Kemps, MD Referring diagnosis? M17.11 (ICD-10-CM) - Unilateral primary osteoarthritis, right knee Treatment diagnosis? (if different than referring diagnosis) Status post total right knee replacement   Muscle weakness (generalized)   Acute pain of right knee   What was this (referring dx) caused by? Surgery (Type: R TKA)   Nature of Condition: Initial Onset (within last 3 months)              Laterality: Rt   Current Functional Measure Score: LEFS 41 to 49 on 10/15/24   Objective measurements identify impairments when they are compared to normal values, the uninvolved extremity, and prior level of function.             [x]  Yes             []  No   Objective assessment of functional ability: Moderate functional limitations              Briefly describe symptoms: discomfort/ache when trying to sleep, weakness of the R leg, decreased R knee flexion AROM    How did symptoms start: n/a   Average pain intensity:             Last 24 hours:1-2/10             Past week:1-2/10   How often does the pt experience symptoms? Intermittent   How much have the symptoms interfered with usual daily activities? Moderately   How has condition changed since care began at this facility? Yes, pt is making appropriate progress with R knee AROM, strength, and function for time frame s/p R TKA    In general, how is the patients overall health? Good     BACK PAIN (STarT Back Screening Tool) No       "

## 2024-10-22 ENCOUNTER — Ambulatory Visit

## 2024-10-22 DIAGNOSIS — Z96651 Presence of right artificial knee joint: Secondary | ICD-10-CM | POA: Diagnosis not present

## 2024-10-22 DIAGNOSIS — M25561 Pain in right knee: Secondary | ICD-10-CM

## 2024-10-22 DIAGNOSIS — M6281 Muscle weakness (generalized): Secondary | ICD-10-CM

## 2024-10-22 NOTE — Therapy (Signed)
 " OUTPATIENT PHYSICAL THERAPY LOWER EXTREMITY TREATMENT/ReAuth   Patient Name: Christopher Spence MRN: [id number] DOB:05/23/1961, 64 y.o., male Today's Date: 10/22/2024  END OF SESSION:  PT End of Session - 10/22/24 1451     Visit Number 8    Number of Visits 24    Date for Recertification  12/26/24    Authorization Type [employer] Medicare    Authorization Time Period Approved 6 PT visits from 09/27/24-11/22/24    PT Start Time 1445    PT Stop Time 1525    PT Time Calculation (min) 40 min    Activity Tolerance Patient tolerated treatment well    Behavior During Therapy [facility] for tasks assessed/performed             Past Medical History:  Diagnosis Date   Allergy    occ- mild   Arthritis    knees   Diabetes (HCC)    Hyperlipidemia    Hypertension    Personal history of colonic polyps-adenomas 10/15/2012   Sleep apnea    Past Surgical History:  Procedure Laterality Date   COLONOSCOPY  10/15/2012   Procedure: COLONOSCOPY;  Surgeon: Lupita FORBES Commander, MD;  Location: [facility];  Service: Endoscopy;  Laterality: N/A;   COLONOSCOPY     Family history of colon cancer     KNEE ARTHROSCOPY     bilateral; twice on each knee   KNEE ARTHROTOMY  1976   right   POLYPECTOMY     UMBILICAL HERNIA REPAIR     x 2   Patient Active Problem List   Diagnosis Date Noted   Arthralgia of left knee 07/08/2024   Dupuytren's disease of palm of left hand 01/22/2021   Chronic pain of both knees 01/29/2016   Swimmer's ear 04/22/2013   Hypertension    Hyperlipidemia    Diabetes (HCC)    History of colonic polyps 10/15/2012    PCP: Duanne Butler DASEN, MD Ref Provider (PCP)   REFERRING PROVIDER: Rubie Kemps, MD  REFERRING DIAG: M17.11 (ICD-10-CM) - Unilateral primary osteoarthritis, right knee  THERAPY DIAG:  Muscle weakness (generalized)  Acute pain of right knee  Status post total right knee replacement  Rationale for Evaluation and Treatment: rehabilitation  ONSET DATE:  09/16/24  SUBJECTIVE:   SUBJECTIVE STATEMENT: Doing well with HEP. Just saw surgeon and was pleased with current progress at the moment. No pain today.  EVAL: Has been using CPM machine consistently alongside doing HEP.   PERTINENT HISTORY: N/a PAIN:  Currently: 0/10 6W, 0C   PRECAUTIONS: None  RED FLAGS: None   WEIGHT BEARING RESTRICTIONS: No  FALLS:  Has patient fallen in last 6 months? Yes. Number of falls 1  OCCUPATION: n/a  PLOF: Independent  PATIENT GOALS: improve strength, stairs, light jog if possible  OBJECTIVE:  Note: Objective measures were completed at Evaluation unless otherwise noted.   PATIENT SURVEYS:  LEFS  Extreme difficulty/unable (0), Quite a bit of difficulty (1), Moderate difficulty (2), Little difficulty (3), No difficulty (4) Survey date:    Any of your usual work, housework or school activities   2. Usual hobbies, recreational or sporting activities   3. Getting into/out of the bath   4. Walking between rooms   5. Putting on socks/shoes   6. Squatting    7. Lifting an object, like a bag of groceries from the floor   8. Performing light activities around your home   9. Performing heavy activities around your home   10. Getting into/out  of a car   11. Walking 2 blocks   12. Walking 1 mile   13. Going up/down 10 stairs (1 flight)   14. Standing for 1 hour   15.  sitting for 1 hour   16. Running on even ground   17. Running on uneven ground   18. Making sharp turns while running fast   19. Hopping    20. Rolling over in bed   Score total:  41     COGNITION: Overall cognitive status: Within functional limits for tasks assessed     SENSATION: WFL   POSTURE: rounded shoulders and forward head  PALPATION: TTP R knee  LOWER EXTREMITY ROM:  Active ROM Right eval Left eval RT 10/01/24 RT 10/03/24 Rt 10/15/24  Hip flexion       Hip extension       Hip abduction       Hip adduction       Hip internal rotation       Hip  external rotation       Knee flexion 90 100 100P 102A 110A  Knee extension -15 -10 -5 -5 0A  Ankle dorsiflexion       Ankle plantarflexion       Ankle inversion       Ankle eversion        (Blank rows = not tested)  LOWER EXTREMITY MMT:  MMT Right eval Left eval RT 10/15/24  Hip flexion 4- 4- 4  Hip extension     Hip abduction 3+ 3+ 4  Hip adduction 3+ 3+ 4  Hip internal rotation     Hip external rotation     Knee flexion 4 4+ 4+  Knee extension 3+! 4+ 4+  Ankle dorsiflexion     Ankle plantarflexion     Ankle inversion     Ankle eversion      (Blank rows = not tested)      GAIT: Distance walked: 30 Assistive device utilized: None Level of assistance: Complete Independence Comments: very slight limping gait on R side                                                                                                                                TREATMENT DATE:  10/22/24 Therapeutic Exercise/Activity: Bike x5 min Leg extension x8x3s 5#, 2x8x3s (BL concentric, RLE eccentric) UL leg extension 2x4x3s 5# (with assistance for full extension) UL leg curl x8x3s 10#, x6x3s 15#, 20# x6x3s Red sport band TKE 2x8x3s 4 inch step up x8 Contact Guard    10/17/24 Therapeutic Exercise/activity: HEP reassessment and update Seated LAQ x8x3s Blue TB, 2x6x3s Black TB Seated HS curl x8x3s Blue TB, 2x6x3s Black TB Seated march 2x8x3s blue TB  Supine bridge 2x8x3s 4 inch step up with CG x6 SL leg press x8 35#  Did not do: Leg extension 5# x6, x4x3s SL leg extension 35# x6    [facility] Adult PT Treatment:  DATE: 10/15/24 Therapeutic Exercise/Activity: Nustep x61min L6 Heel slides x10, x3 20 c strap Quad sets x10 5 LEFS 48/80 MMT Seated LAQ x8x3s GTB, 2x8x3s Blue TB Seated HS curl 2x8x3s Blue TB FWD step ups/downs 6 step x10 c mod use of L hand for assist   FWD step ups/downs 4 step x10 c min use of L hand for assist   SL leg  extension L 40# x5; R unable to move 20# (the lowest weight) due to weakness Bil leg extension press c min A L LE 20# x10 HEP reassessment and update   10/10/24 Therapeutic Exercise/Activity: Nustep x37min L4 HEP reassessment and update Seated LAQ x8x3s GTB, 2x8x3s Blue TB Seated HS curl 2x8x3s Blue TB Seated march 2x8x3s blue TB  Seated blue TB clamshell 2x8x3s Supine bridge 2x8x3s Standing to floor back to standing with B UE counter support x1 Leg extension 5# x6, x4x3s SL leg extension 35# x6 6 inch step up with SUE x6  PATIENT EDUCATION:  Education details: HEP Person educated: Patient Education method: Programmer, Multimedia, Facilities Manager, and Handouts Education comprehension: verbalized understanding and returned demonstration  HOME EXERCISE PROGRAM: Access Code: [email or web address] URL: [email or web address] Date: 10/10/2024 Prepared by: Washington Scot  Exercises - Sit to Stand with Arms Crossed  - 2 x daily - 5 x weekly - 2 sets - 8 reps - 3 hold - Single Leg Knee Extension with Weight Machine  - 2 x daily - 5 x weekly - 2 sets - 6 reps - 3 hold - Single Leg Hamstring Curl with Weight Machine  - 2 x daily - 5 x weekly - 2 sets - 6 reps - 3 hold - Single Leg Press  - 2 x daily - 5 x weekly - 2 sets - 6 reps - 3 hold - Forward Step Up with Counter Support  - 2 x daily - 5 x weekly - 2 sets - 6 reps - 3 hold    ASSESSMENT:  CLINICAL IMPRESSION: Patient tolerated treatment with no increases in pain with progressions in RLE isolated and functional loading. Current deficits include: functional activity and strength, excessive pain, and ROM. As a result, patient would continue to benefit from skilled PT to address said deficits via plan below.    EVAL: Patient is a 64 year old male who presents with s/p R TKA on 09/16/24. Patient presents with deficits in: BL hip strength, excessive pain, and ROM. As a result, the patient would benefit from skilled PT to address aforementioned  deficits via plan below.   OBJECTIVE IMPAIRMENTS: decreased ROM and decreased strength.   ACTIVITY LIMITATIONS: standing, squatting, and stairs  PERSONAL FACTORS: Age are also affecting patient's functional outcome.   REHAB POTENTIAL: Good  CLINICAL DECISION MAKING: Evolving/moderate complexity  EVALUATION COMPLEXITY: Moderate   GOALS: Goals reviewed with patient? No  SHORT TERM GOALS: Target date: 10/18/2024   1) Patient will demonstrate 75% HEP compliance to show independence with self-management of condition  Baseline: 0% Goal status: MET- 100% compliance with daily completion  2) Patient will decrease worst pain/discomfort to 4 at most to improve ADL completion and overall QOL   Baseline:  Goal status: MET- 1-2/10 at most    LONG TERM GOALS: Target date: 12/26/24   1) Patient will demonstrate 100% HEP compliance to show independence with self-management of condition Baseline: 0% Goal status: ONGOING  2) Patient will decrease worst pain to 2 at most to improve ADL completion and overall QOL Baseline: 6 Goal status: ONGOING  3) Patient  will demonstrate a 10 point improvement in LEFS to show improvements in ADL completion and overall QOL Baseline: 41 10/15/24: 48 Goal status: IMPROVED  4) Patient will demonstrate at least 4/5 MMT score of R knee and glutes to show improvements in muscle strength needed for ADL completion.  Baseline: see chart 10/16/23: see flow sheet Goal status: IMPROVED  5) Patient will demonstrate ability to ambulate 10 stairs with no significant increases in pain to demonstrate functional strength for community ambulation. Baseline: Completed c primary use of L LE and UEs Goal status: ONGOING  6) Patient will demonstrate 120d of L knee flexion to dsc steps with L LE as the lead leg Baseline: 110d Goal status: New 10/15/24  7) Patient will demonstrate completion of the R leg press at 35# for 5 steps for improved ability to ascend/descend  steps with an alternating pattern between LEs Baseline: Uses L LE primarily Goal status: New 10/15/24      PLAN:  PT FREQUENCY: 1-2x/week  PT DURATION: 12 weeks  PLANNED INTERVENTIONS: 97110-Therapeutic exercises, 97530- Therapeutic activity, 97112- Neuromuscular re-education, 97535- Self Care, [id number]- Manual therapy, [phone]- Gait training, Patient/Family education, Balance training, and Stair training  PLAN FOR NEXT SESSION: HEP assessment and progression, symptom modulation, and loading (isolated and/or functional). Manual therapy, aerobic, gait, and NME training as needed. Improve R knee ROM via loading as tolerated. Improve UL leg extension and stairs/step up strength.   Washington Odessia Scot  PT, DPT   Date of referral: 08/14/24 Referring provider: Rubie Kemps, MD Referring diagnosis? M17.11 (ICD-10-CM) - Unilateral primary osteoarthritis, right knee Treatment diagnosis? (if different than referring diagnosis) Status post total right knee replacement   Muscle weakness (generalized)   Acute pain of right knee   What was this (referring dx) caused by? Surgery (Type: R TKA)   Nature of Condition: Initial Onset (within last 3 months)              Laterality: Rt   Current Functional Measure Score: LEFS 41 to 49 on 10/15/24   Objective measurements identify impairments when they are compared to normal values, the uninvolved extremity, and prior level of function.             [x]  Yes             []  No   Objective assessment of functional ability: Moderate functional limitations              Briefly describe symptoms: discomfort/ache when trying to sleep, weakness of the R leg, decreased R knee flexion AROM    How did symptoms start: n/a   Average pain intensity:             Last 24 hours:1-2/10             Past week:1-2/10   How often does the pt experience symptoms? Intermittent   How much have the symptoms interfered with usual daily activities? Moderately   How  has condition changed since care began at this facility? Yes, pt is making appropriate progress with R knee AROM, strength, and function for time frame s/p R TKA    In general, how is the patients overall health? Good     BACK PAIN (STarT Back Screening Tool) No       "

## 2024-10-24 ENCOUNTER — Ambulatory Visit

## 2024-10-24 DIAGNOSIS — Z96651 Presence of right artificial knee joint: Secondary | ICD-10-CM

## 2024-10-24 DIAGNOSIS — M25561 Pain in right knee: Secondary | ICD-10-CM

## 2024-10-24 DIAGNOSIS — M6281 Muscle weakness (generalized): Secondary | ICD-10-CM

## 2024-10-24 NOTE — Therapy (Signed)
 " OUTPATIENT PHYSICAL THERAPY LOWER EXTREMITY TREATMENT/ReAuth   Patient Name: Christopher Spence MRN: [id number] DOB:09/26/1961, 64 y.o., male Today's Date: 10/24/2024  END OF SESSION:  PT End of Session - 10/24/24 0931     Visit Number 9    Number of Visits 24    Date for Recertification  12/26/24    Authorization Type [employer] Medicare    Authorization Time Period Approved 6 PT visits from 09/27/24-11/22/24    PT Start Time 0930    PT Stop Time 1010    PT Time Calculation (min) 40 min    Activity Tolerance Patient tolerated treatment well    Behavior During Therapy [facility] for tasks assessed/performed             Past Medical History:  Diagnosis Date   Allergy    occ- mild   Arthritis    knees   Diabetes (HCC)    Hyperlipidemia    Hypertension    Personal history of colonic polyps-adenomas 10/15/2012   Sleep apnea    Past Surgical History:  Procedure Laterality Date   COLONOSCOPY  10/15/2012   Procedure: COLONOSCOPY;  Surgeon: Lupita FORBES Commander, MD;  Location: [facility];  Service: Endoscopy;  Laterality: N/A;   COLONOSCOPY     Family history of colon cancer     KNEE ARTHROSCOPY     bilateral; twice on each knee   KNEE ARTHROTOMY  1976   right   POLYPECTOMY     UMBILICAL HERNIA REPAIR     x 2   Patient Active Problem List   Diagnosis Date Noted   Arthralgia of left knee 07/08/2024   Dupuytren's disease of palm of left hand 01/22/2021   Chronic pain of both knees 01/29/2016   Swimmer's ear 04/22/2013   Hypertension    Hyperlipidemia    Diabetes (HCC)    History of colonic polyps 10/15/2012    PCP: Duanne Butler DASEN, MD Ref Provider (PCP)   REFERRING PROVIDER: Rubie Kemps, MD  REFERRING DIAG: M17.11 (ICD-10-CM) - Unilateral primary osteoarthritis, right knee  THERAPY DIAG:  Muscle weakness (generalized)  Acute pain of right knee  Status post total right knee replacement  Rationale for Evaluation and Treatment: rehabilitation  ONSET DATE:  09/16/24  SUBJECTIVE:   SUBJECTIVE STATEMENT: Doing well today and has been going to the gym and doing HEP consistently. No pain today.  EVAL: Has been using CPM machine consistently alongside doing HEP.   PERTINENT HISTORY: N/a PAIN:  Currently: 0/10 6W, 0C   PRECAUTIONS: None  RED FLAGS: None   WEIGHT BEARING RESTRICTIONS: No  FALLS:  Has patient fallen in last 6 months? Yes. Number of falls 1  OCCUPATION: n/a  PLOF: Independent  PATIENT GOALS: improve strength, stairs, light jog if possible  OBJECTIVE:  Note: Objective measures were completed at Evaluation unless otherwise noted.   PATIENT SURVEYS:  LEFS  Extreme difficulty/unable (0), Quite a bit of difficulty (1), Moderate difficulty (2), Little difficulty (3), No difficulty (4) Survey date:    Any of your usual work, housework or school activities   2. Usual hobbies, recreational or sporting activities   3. Getting into/out of the bath   4. Walking between rooms   5. Putting on socks/shoes   6. Squatting    7. Lifting an object, like a bag of groceries from the floor   8. Performing light activities around your home   9. Performing heavy activities around your home   10. Getting into/out of a  car   11. Walking 2 blocks   12. Walking 1 mile   13. Going up/down 10 stairs (1 flight)   14. Standing for 1 hour   15.  sitting for 1 hour   16. Running on even ground   17. Running on uneven ground   18. Making sharp turns while running fast   19. Hopping    20. Rolling over in bed   Score total:  41     COGNITION: Overall cognitive status: Within functional limits for tasks assessed     SENSATION: WFL   POSTURE: rounded shoulders and forward head  PALPATION: TTP R knee  LOWER EXTREMITY ROM:  Active ROM Right eval Left eval RT 10/01/24 RT 10/03/24 Rt 10/15/24  Hip flexion       Hip extension       Hip abduction       Hip adduction       Hip internal rotation       Hip external rotation        Knee flexion 90 100 100P 102A 110A  Knee extension -15 -10 -5 -5 0A  Ankle dorsiflexion       Ankle plantarflexion       Ankle inversion       Ankle eversion        (Blank rows = not tested)  LOWER EXTREMITY MMT:  MMT Right eval Left eval RT 10/15/24  Hip flexion 4- 4- 4  Hip extension     Hip abduction 3+ 3+ 4  Hip adduction 3+ 3+ 4  Hip internal rotation     Hip external rotation     Knee flexion 4 4+ 4+  Knee extension 3+! 4+ 4+  Ankle dorsiflexion     Ankle plantarflexion     Ankle inversion     Ankle eversion      (Blank rows = not tested)      GAIT: Distance walked: 30 Assistive device utilized: None Level of assistance: Complete Independence Comments: very slight limping gait on R side                                                                                                                                TREATMENT DATE:  10/24/24 Therapeutic Exercise/Activity: HEP update and reassessment Bike x5 min UL leg curl x6x3s 20#, x8x3s 25#, x4x3s, x6x3s 35# BL Leg extension x8x3s 5#, 10# x4x3s(BL concentric, RLE eccentric) UL leg extension 2x4x3s 5# (with assistance for full extension) (able to get 1 rep close to full extension) Red sport band TKE 2x8x3s 4 inch step up x8 Contact Guard 6 inch step up x4 minA   10/22/24 Therapeutic Exercise/Activity: Bike x5 min Leg extension x8x3s 5#, x8x3s,  (BL concentric, RLE eccentric) UL leg extension 2x4x3s 5# (with assistance for full extension) (able to do 1 rep with full extension) UL leg curl x8x3s 10#, x6x3s 15#, 20# x6x3s Red sport band TKE 2x8x3s  4 inch step up x8 Contact Guard    10/17/24 Therapeutic Exercise/activity: HEP reassessment and update Seated LAQ x8x3s Blue TB, 2x6x3s Black TB Seated HS curl x8x3s Blue TB, 2x6x3s Black TB Seated march 2x8x3s blue TB  Supine bridge 2x8x3s 4 inch step up with CG x6 SL leg press x8 35#  Did not do: Leg extension 5# x6, x4x3s SL leg extension 35#  x6    [facility] Adult PT Treatment:                                                DATE: 10/15/24 Therapeutic Exercise/Activity: Nustep x75min L6 Heel slides x10, x3 20 c strap Quad sets x10 5 LEFS 48/80 MMT Seated LAQ x8x3s GTB, 2x8x3s Blue TB Seated HS curl 2x8x3s Blue TB FWD step ups/downs 6 step x10 c mod use of L hand for assist   FWD step ups/downs 4 step x10 c min use of L hand for assist   SL leg extension L 40# x5; R unable to move 20# (the lowest weight) due to weakness Bil leg extension press c min A L LE 20# x10 HEP reassessment and update   10/10/24 Therapeutic Exercise/Activity: Nustep x20min L4 HEP reassessment and update Seated LAQ x8x3s GTB, 2x8x3s Blue TB Seated HS curl 2x8x3s Blue TB Seated march 2x8x3s blue TB  Seated blue TB clamshell 2x8x3s Supine bridge 2x8x3s Standing to floor back to standing with B UE counter support x1 Leg extension 5# x6, x4x3s SL leg extension 35# x6 6 inch step up with SUE x6  PATIENT EDUCATION:  Education details: HEP Person educated: Patient Education method: Programmer, Multimedia, Facilities Manager, and Handouts Education comprehension: verbalized understanding and returned demonstration  HOME EXERCISE PROGRAM: Access Code: [email or web address] URL: [email or web address] Date: 10/10/2024 Prepared by: Washington Scot  Exercises - Sit to Stand with Arms Crossed  - 2 x daily - 5 x weekly - 2 sets - 8 reps - 3 hold - Single Leg Knee Extension with Weight Machine  - 2 x daily - 5 x weekly - 2 sets - 6 reps - 3 hold - Single Leg Hamstring Curl with Weight Machine  - 2 x daily - 5 x weekly - 2 sets - 6 reps - 3 hold - Single Leg Press  - 2 x daily - 5 x weekly - 2 sets - 6 reps - 3 hold - Forward Step Up with Counter Support  - 2 x daily - 5 x weekly - 2 sets - 6 reps - 3 hold  At gym; Leg press Leg extension (BL concentric, RLE eccentric) Leg curl Hip abduction machine Hip adduction machine  ASSESSMENT:  CLINICAL  IMPRESSION: Patient tolerated treatment with no increases in pain with progressions in RLE isolated and functional loading. Current deficits include: functional activity and strength, excessive pain, and ROM. As a result, patient would continue to benefit from skilled PT to address said deficits via plan below.    EVAL: Patient is a 64 year old male who presents with s/p R TKA on 09/16/24. Patient presents with deficits in: BL hip strength, excessive pain, and ROM. As a result, the patient would benefit from skilled PT to address aforementioned deficits via plan below.   OBJECTIVE IMPAIRMENTS: decreased ROM and decreased strength.   ACTIVITY LIMITATIONS: standing, squatting, and stairs  PERSONAL FACTORS: Age are also affecting patient's functional outcome.  REHAB POTENTIAL: Good  CLINICAL DECISION MAKING: Evolving/moderate complexity  EVALUATION COMPLEXITY: Moderate   GOALS: Goals reviewed with patient? No  SHORT TERM GOALS: Target date: 10/18/2024   1) Patient will demonstrate 75% HEP compliance to show independence with self-management of condition  Baseline: 0% Goal status: MET- 100% compliance with daily completion  2) Patient will decrease worst pain/discomfort to 4 at most to improve ADL completion and overall QOL   Baseline:  Goal status: MET- 1-2/10 at most    LONG TERM GOALS: Target date: 12/26/24   1) Patient will demonstrate 100% HEP compliance to show independence with self-management of condition Baseline: 0% Goal status: ONGOING  2) Patient will decrease worst pain to 2 at most to improve ADL completion and overall QOL Baseline: 6 Goal status: ONGOING  3) Patient will demonstrate a 10 point improvement in LEFS to show improvements in ADL completion and overall QOL Baseline: 41 10/15/24: 48 Goal status: IMPROVED  4) Patient will demonstrate at least 4/5 MMT score of R knee and glutes to show improvements in muscle strength needed for ADL completion.   Baseline: see chart 10/16/23: see flow sheet Goal status: IMPROVED  5) Patient will demonstrate ability to ambulate 10 stairs with no significant increases in pain to demonstrate functional strength for community ambulation. Baseline: Completed c primary use of L LE and UEs Goal status: ONGOING  6) Patient will demonstrate 120d of L knee flexion to dsc steps with L LE as the lead leg Baseline: 110d Goal status: New 10/15/24  7) Patient will demonstrate completion of the R leg press at 35# for 5 steps for improved ability to ascend/descend steps with an alternating pattern between LEs Baseline: Uses L LE primarily Goal status: New 10/15/24      PLAN:  PT FREQUENCY: 1-2x/week  PT DURATION: 12 weeks  PLANNED INTERVENTIONS: 97110-Therapeutic exercises, 97530- Therapeutic activity, 97112- Neuromuscular re-education, 97535- Self Care, [id number]- Manual therapy, [phone]- Gait training, Patient/Family education, Balance training, and Stair training  PLAN FOR NEXT SESSION: HEP assessment and progression, symptom modulation, and loading (isolated and/or functional). Manual therapy, aerobic, gait, and NME training as needed. Improve R knee ROM via loading as tolerated. Improve UL leg extension and stairs/step up strength.   Washington Odessia Scot  PT, DPT   Date of referral: 08/14/24 Referring provider: Rubie Kemps, MD Referring diagnosis? M17.11 (ICD-10-CM) - Unilateral primary osteoarthritis, right knee Treatment diagnosis? (if different than referring diagnosis) Status post total right knee replacement   Muscle weakness (generalized)   Acute pain of right knee   What was this (referring dx) caused by? Surgery (Type: R TKA)   Nature of Condition: Initial Onset (within last 3 months)              Laterality: Rt   Current Functional Measure Score: LEFS 41 to 49 on 10/15/24   Objective measurements identify impairments when they are compared to normal values, the uninvolved extremity,  and prior level of function.             [x]  Yes             []  No   Objective assessment of functional ability: Moderate functional limitations              Briefly describe symptoms: discomfort/ache when trying to sleep, weakness of the R leg, decreased R knee flexion AROM    How did symptoms start: n/a   Average pain intensity:  Last 24 hours:1-2/10             Past week:1-2/10   How often does the pt experience symptoms? Intermittent   How much have the symptoms interfered with usual daily activities? Moderately   How has condition changed since care began at this facility? Yes, pt is making appropriate progress with R knee AROM, strength, and function for time frame s/p R TKA    In general, how is the patients overall health? Good     BACK PAIN (STarT Back Screening Tool) No       "

## 2024-10-25 ENCOUNTER — Telehealth: Payer: Self-pay

## 2024-10-25 ENCOUNTER — Other Ambulatory Visit: Payer: Self-pay

## 2024-10-25 DIAGNOSIS — G8929 Other chronic pain: Secondary | ICD-10-CM

## 2024-10-25 NOTE — Telephone Encounter (Signed)
 Copied from CRM [id number]. Topic: Referral - Request for Referral >> Oct 25, 2024  9:43 AM Gattis SQUIBB wrote: Pt called saying [facility] is needing an updated referral for Dr. Rubie.  He has a FU March 12th for knee replacement he had done in Dec.

## 2024-10-25 NOTE — Telephone Encounter (Signed)
 Error

## 2024-10-29 ENCOUNTER — Ambulatory Visit

## 2024-10-29 DIAGNOSIS — M25561 Pain in right knee: Secondary | ICD-10-CM

## 2024-10-29 DIAGNOSIS — Z96651 Presence of right artificial knee joint: Secondary | ICD-10-CM

## 2024-10-29 DIAGNOSIS — M6281 Muscle weakness (generalized): Secondary | ICD-10-CM

## 2024-10-31 ENCOUNTER — Ambulatory Visit

## 2024-11-05 ENCOUNTER — Ambulatory Visit

## 2024-11-07 ENCOUNTER — Ambulatory Visit

## 2024-11-12 ENCOUNTER — Ambulatory Visit

## 2024-11-14 ENCOUNTER — Ambulatory Visit
